# Patient Record
Sex: Female | Born: 1980 | Hispanic: Yes | Marital: Married | Smoking: Never smoker
Health system: Southern US, Community
[De-identification: ages and names within clinical notes are randomized; demographics above are authoritative.]

## PROBLEM LIST (undated history)

## (undated) ENCOUNTER — Inpatient Hospital Stay (HOSPITAL_COMMUNITY): Payer: Self-pay

## (undated) DIAGNOSIS — Z8619 Personal history of other infectious and parasitic diseases: Secondary | ICD-10-CM

## (undated) DIAGNOSIS — Z789 Other specified health status: Secondary | ICD-10-CM

## (undated) DIAGNOSIS — O0930 Supervision of pregnancy with insufficient antenatal care, unspecified trimester: Secondary | ICD-10-CM

## (undated) DIAGNOSIS — N83209 Unspecified ovarian cyst, unspecified side: Secondary | ICD-10-CM

## (undated) DIAGNOSIS — IMO0002 Reserved for concepts with insufficient information to code with codable children: Secondary | ICD-10-CM

## (undated) HISTORY — DX: Unspecified ovarian cyst, unspecified side: N83.209

## (undated) HISTORY — DX: Reserved for concepts with insufficient information to code with codable children: IMO0002

## (undated) HISTORY — DX: Other specified health status: Z78.9

## (undated) HISTORY — DX: Supervision of pregnancy with insufficient antenatal care, unspecified trimester: O09.30

## (undated) HISTORY — PX: CRYOTHERAPY: SHX1416

## (undated) HISTORY — DX: Personal history of other infectious and parasitic diseases: Z86.19

---

## 2002-02-08 ENCOUNTER — Ambulatory Visit (HOSPITAL_COMMUNITY): Admission: RE | Admit: 2002-02-08 | Discharge: 2002-02-08 | Payer: Self-pay | Admitting: *Deleted

## 2002-07-14 ENCOUNTER — Encounter: Admission: RE | Admit: 2002-07-14 | Discharge: 2002-07-14 | Payer: Self-pay | Admitting: Obstetrics and Gynecology

## 2002-07-18 ENCOUNTER — Inpatient Hospital Stay (HOSPITAL_COMMUNITY): Admission: AD | Admit: 2002-07-18 | Discharge: 2002-07-20 | Payer: Self-pay | Admitting: *Deleted

## 2007-03-17 ENCOUNTER — Ambulatory Visit: Payer: Self-pay | Admitting: Obstetrics & Gynecology

## 2007-03-18 ENCOUNTER — Ambulatory Visit: Payer: Self-pay | Admitting: Obstetrics & Gynecology

## 2007-04-15 ENCOUNTER — Ambulatory Visit: Payer: Self-pay | Admitting: *Deleted

## 2007-07-22 ENCOUNTER — Ambulatory Visit: Payer: Self-pay | Admitting: Gynecology

## 2007-07-22 ENCOUNTER — Encounter (INDEPENDENT_AMBULATORY_CARE_PROVIDER_SITE_OTHER): Payer: Self-pay | Admitting: Gynecology

## 2009-10-08 ENCOUNTER — Ambulatory Visit (HOSPITAL_COMMUNITY): Admission: RE | Admit: 2009-10-08 | Discharge: 2009-10-08 | Payer: Self-pay | Admitting: Family Medicine

## 2009-11-15 ENCOUNTER — Ambulatory Visit: Payer: Self-pay | Admitting: Family Medicine

## 2010-06-25 NOTE — Group Therapy Note (Signed)
NAMEANALISSA, BAYLESS NO.:  [id number]   MEDICAL RECORD NO.:  [id number]          PATIENT TYPE:  WOC   LOCATION:  [facility]                   FACILITY:  [facility]   PHYSICIAN:  Ginger Carne, MD DATE OF BIRTH:  Sep 26, 1980   DATE OF SERVICE:  07/22/2007                                  CLINIC NOTE   CHIEF COMPLAINT:  Follow-up from cryotherapy.   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  The patient has no specific complaints today  and this is four months out from her treatment.  She had a Pap smear  performed.  External genitalia, vulva and vagina normal.  Cervix is  smooth without erosions or lesions.   PLAN:  Assuming that this Pap smears normal, she was requested to have  two follow-up Pap smears six months apart and if normal, she can resume  yearly examinations.           ______________________________  Ginger Carne, MD     SHB/MEDQ  D:  07/22/2007  T:  07/22/2007  Job:  [id number]

## 2010-06-25 NOTE — Group Therapy Note (Signed)
NAME:  Emma Walter, Emma Walter NO.:  [id number]   MEDICAL RECORD NO.:  [id number]          PATIENT TYPE:  WOC   LOCATION:  [facility]                   FACILITY:  [facility]   PHYSICIAN:  Karlton Lemon, MD      DATE OF BIRTH:  01/06/1981   DATE OF SERVICE:  04/15/2007                                  CLINIC NOTE   CHIEF COMPLAINT:  Followup from cryotherapy.   HISTORY OF PRESENT ILLNESS:  This is a 30 year old gravida 2, para 2-0-0-  2 following up from cryotherapy performed four weeks ago due to CIN-3 on  colposcopy.  She states she is doing well.  She is no longer having any  watery discharge.  She states she did have a period consisting of just  scant bleeding over the last 2 weeks.  She bled for just a couple of  days.  This was atypical for her normal periods.  She is otherwise doing  well.   PHYSICAL EXAMINATION:  VITAL SIGNS:  Temperature 98.3, pulse 78, blood  pressure 90/63, respiratory rate is 20.  ABDOMEN:  Soft, nontender to palpation.  Positive bowel sounds in all  four quadrants.   ASSESSMENT/PLAN:  This is a 30 year old gravida 2, para 2, with  colposcopy showing CIN-3, now status post cryotherapy performed four  weeks ago on March 18, 2007.  The patient will discuss with Dr. Okey Dupre  and will repeat a Pap smear in 3 months which will be 4 months after her  cryotherapy.           ______________________________  Karlton Lemon, MD     NS/MEDQ  D:  04/15/2007  T:  04/15/2007  Job:  [id number]

## 2010-11-01 LAB — POCT PREGNANCY, URINE
Operator id: 148111
Preg Test, Ur: NEGATIVE

## 2011-02-11 NOTE — L&D Delivery Note (Signed)
Delivery Note At 4:10 AM a healthy female was delivered via Vaginal, Spontaneous Delivery (Presentation: Right Occiput Anterior).  APGAR: 9, 9; weight 8 lb 13.1 oz (4000 g).   Placenta status: Intact, Spontaneous.  Cord: 3 vessels with the following complications: None.    Anesthesia: None  Episiotomy: None Lacerations: 1st degree perineal, minimal not requiring repair Suture Repair: None Est. Blood Loss (mL): 350  Mom to postpartum.  Baby to nursery-stable.  Tana Conch 09/28/2011, 4:47 AM  Supervised by Philipp Deputy, CNM  I have seen and examined this patient and I agree with the above. Cam Hai 7:34 AM 09/28/2011

## 2011-06-10 LAB — OB RESULTS CONSOLE ABO/RH: RH Type: POSITIVE

## 2011-06-10 LAB — OB RESULTS CONSOLE HIV ANTIBODY (ROUTINE TESTING): HIV: NONREACTIVE

## 2011-06-10 LAB — OB RESULTS CONSOLE HEPATITIS B SURFACE ANTIGEN: Hepatitis B Surface Ag: NEGATIVE

## 2011-06-10 LAB — OB RESULTS CONSOLE GC/CHLAMYDIA
Chlamydia: NEGATIVE
Gonorrhea: NEGATIVE

## 2011-06-10 LAB — OB RESULTS CONSOLE RPR: RPR: NONREACTIVE

## 2011-08-26 LAB — OB RESULTS CONSOLE GBS: GBS: NEGATIVE

## 2011-09-24 ENCOUNTER — Telehealth (HOSPITAL_COMMUNITY): Payer: Self-pay | Admitting: *Deleted

## 2011-09-24 ENCOUNTER — Encounter (HOSPITAL_COMMUNITY): Payer: Self-pay | Admitting: *Deleted

## 2011-09-24 NOTE — Telephone Encounter (Signed)
Preadmission screen Interpreter number [phone]

## 2011-09-26 ENCOUNTER — Ambulatory Visit (INDEPENDENT_AMBULATORY_CARE_PROVIDER_SITE_OTHER): Payer: Self-pay | Admitting: *Deleted

## 2011-09-26 VITALS — BP 97/69 | Wt 148.9 lb

## 2011-09-26 DIAGNOSIS — O480 Post-term pregnancy: Secondary | ICD-10-CM

## 2011-09-26 NOTE — Progress Notes (Signed)
P = 69   Pt is scheduled for IOL on 10/03/11 @ 0730.  Labor sx reviewed. Interpreter- Marchelle Folks present during entire visit.  Copy of NST/AFI results faxed to [facility].

## 2011-09-27 ENCOUNTER — Encounter (HOSPITAL_COMMUNITY): Payer: Self-pay | Admitting: *Deleted

## 2011-09-27 ENCOUNTER — Inpatient Hospital Stay (HOSPITAL_COMMUNITY)
Admission: AD | Admit: 2011-09-27 | Discharge: 2011-09-29 | DRG: 775 | Disposition: A | Discharge status: Home / Self Care | Payer: Medicaid Other | Source: Ambulatory Visit | Attending: Obstetrics & Gynecology | Admitting: Obstetrics & Gynecology

## 2011-09-27 DIAGNOSIS — Z3490 Encounter for supervision of normal pregnancy, unspecified, unspecified trimester: Secondary | ICD-10-CM

## 2011-09-27 NOTE — MAU Note (Signed)
Pt to be reevaluated in one hour. 

## 2011-09-27 NOTE — H&P (Signed)
Emma Walter is a 31 y.o. female presenting for labor evaluation.  Contractions started 7 am became more intense around 5pm. Started out every 7 minutes, now every 5 minutes or less. At 9pm, patient states became unable to talk through contractions.   Patient denies decreased fetal movement/blood from vagina (slight brown discharge noted) /rush of fluid.   Maternal Medical History:  Reason for admission: Reason for admission: contractions.  Contractions: Onset was 13-24 hours ago.   Frequency: regular.   Perceived severity is moderate.    Fetal activity: Perceived fetal activity is normal.   Last perceived fetal movement was within the past hour.      OB History    Grav Para Term Preterm Abortions TAB SAB Ect Mult Living   3 2 2       2      Past Medical History  Diagnosis Date  . Hx of chlamydia infection   . Ovarian cyst     History of L  . Abnormal Pap smear   . Late prenatal care   . Language Barrier    Past Surgical History  Procedure Date  . Cryotherapy    Family History: family history includes Birth defects in her sister. Social History:  reports that she has never smoked. She has never used smokeless tobacco. She reports that she does not drink alcohol or use illicit drugs.   Prenatal Transfer Tool  Maternal Diabetes: No Genetic Screening: Declined Maternal Ultrasounds/Referrals: Normal Fetal Ultrasounds or other Referrals:  None Maternal Substance Abuse:  No Significant Maternal Medications:  None Significant Maternal Lab Results:  None Other Comments:  None  ROS-see HPI  Dilation: 6 Effacement (%): 70 Station: -2 Exam by:: a. harper, rnc-ob Blood pressure 110/77, pulse 62, temperature 98.1 F (36.7 C), temperature source Oral, resp. rate 20, height 5\' 2"  (1.575 m), weight 69.4 kg (153 lb), last menstrual period 12/17/2010. Maternal Exam:  Uterine Assessment: Contraction strength is firm.  Contraction duration is 1 minute.  Contraction frequency is regular.      Fetal Exam Fetal Monitor Review: Baseline rate: 125.  Variability: moderate (6-25 bpm).   Pattern: accelerations present and no decelerations.    Fetal State Assessment: Category I - tracings are normal.     Physical Exam  Constitutional: She appears well-developed and well-nourished. No distress.  HENT:  Head: Normocephalic and atraumatic.  Eyes: Conjunctivae and EOM are normal. Pupils are equal, round, and reactive to light.  Neck: Normal range of motion. Neck supple.  Cardiovascular: Normal rate and regular rhythm.  Exam reveals no gallop and no friction rub.   No murmur heard. Respiratory: Effort normal and breath sounds normal. She has no wheezes. She has no rales.  GI: Soft. Bowel sounds are normal.  Musculoskeletal: Normal range of motion. She exhibits edema (1+ edema bilaterally).  Neurological: She is alert.  Skin: Skin is warm and dry.    Prenatal labs: ABO, Rh: O/Positive/-- (04/30 0000) Antibody: Negative (04/30 0000) Rubella: Immune (04/30 0000) RPR: Nonreactive (04/30 0000)  HBsAg: Negative (04/30 0000)  HIV: Non-reactive (04/30 0000)  GBS: Negative (07/16 0000)   Assessment/Plan: Emma Walter 31 y.o. female (HD patient)  [phone] at [redacted]w[redacted]d presenting for labor eval found to be in active labor  -admit to L+D -expectant management  -GBS negative -History of LGA at 9 lb 2 oz with last child but no DM (glucola 101)  -plans breast/bottle feeding -OCP for birth control -previous deliveries without epidural, available  upon request  Tana Conch 09/27/2011, 11:32 PM  I have seen and examined this patient and I agree with the above. Cam Hai 4:14 AM 09/28/2011

## 2011-09-27 NOTE — MAU Note (Signed)
Pt states, " I have had contractions all day, and they are closer and stronger now."

## 2011-09-28 ENCOUNTER — Encounter (HOSPITAL_COMMUNITY): Payer: Self-pay | Admitting: *Deleted

## 2011-09-28 LAB — TYPE AND SCREEN
ABO/RH(D): O POS
Antibody Screen: NEGATIVE

## 2011-09-28 LAB — CBC
Platelets: 121 10*3/uL — ABNORMAL LOW (ref 150–400)
RBC: 3.77 MIL/uL — ABNORMAL LOW (ref 3.87–5.11)
WBC: 10.5 10*3/uL (ref 4.0–10.5)

## 2011-09-28 LAB — RPR: RPR Ser Ql: NONREACTIVE

## 2011-09-28 MED ORDER — LANOLIN HYDROUS EX OINT: TOPICAL_OINTMENT | CUTANEOUS | Status: DC | PRN

## 2011-09-28 MED ORDER — OXYCODONE-ACETAMINOPHEN 5-325 MG PO TABS: 1.0000 | ORAL_TABLET | ORAL | Status: DC | PRN

## 2011-09-28 MED ORDER — SENNOSIDES-DOCUSATE SODIUM 8.6-50 MG PO TABS: 2.0000 | ORAL_TABLET | Freq: Every day | ORAL | Status: DC

## 2011-09-28 MED ORDER — ZOLPIDEM TARTRATE 5 MG PO TABS: 5.0000 mg | ORAL_TABLET | Freq: Every evening | ORAL | Status: DC | PRN

## 2011-09-28 MED ORDER — PRENATAL MULTIVITAMIN CH
1.0000 | ORAL_TABLET | Freq: Every day | ORAL | Status: DC
  Administered 2011-09-28: 1 via ORAL
  Filled 2011-09-28 (×2): qty 1

## 2011-09-28 MED ORDER — ONDANSETRON HCL 4 MG PO TABS: 4.0000 mg | ORAL_TABLET | ORAL | Status: DC | PRN

## 2011-09-28 MED ORDER — ONDANSETRON HCL 4 MG/2ML IJ SOLN: 4.0000 mg | Freq: Four times a day (QID) | INTRAMUSCULAR | Status: DC | PRN

## 2011-09-28 MED ORDER — DIBUCAINE 1 % RE OINT: 1.0000 "application " | TOPICAL_OINTMENT | RECTAL | Status: DC | PRN

## 2011-09-28 MED ORDER — LACTATED RINGERS IV SOLN: 500.0000 mL | INTRAVENOUS | Status: DC | PRN

## 2011-09-28 MED ORDER — IBUPROFEN 600 MG PO TABS: 600.0000 mg | ORAL_TABLET | Freq: Four times a day (QID) | ORAL | Status: DC | PRN

## 2011-09-28 MED ORDER — NALBUPHINE SYRINGE 5 MG/0.5 ML
10.0000 mg | INJECTION | INTRAMUSCULAR | Status: DC | PRN
  Filled 2011-09-28: qty 1

## 2011-09-28 MED ORDER — ACETAMINOPHEN 325 MG PO TABS: 650.0000 mg | ORAL_TABLET | ORAL | Status: DC | PRN

## 2011-09-28 MED ORDER — BENZOCAINE-MENTHOL 20-0.5 % EX AERO
1.0000 "application " | INHALATION_SPRAY | CUTANEOUS | Status: DC | PRN
  Administered 2011-09-28: 1 via TOPICAL
  Filled 2011-09-28: qty 56

## 2011-09-28 MED ORDER — LACTATED RINGERS IV SOLN
INTRAVENOUS | Status: DC
  Administered 2011-09-28: 01:00:00 via INTRAVENOUS

## 2011-09-28 MED ORDER — LIDOCAINE HCL (PF) 1 % IJ SOLN
30.0000 mL | INTRAMUSCULAR | Status: DC | PRN
  Filled 2011-09-28: qty 30

## 2011-09-28 MED ORDER — IBUPROFEN 600 MG PO TABS
600.0000 mg | ORAL_TABLET | Freq: Four times a day (QID) | ORAL | Status: DC
  Administered 2011-09-28 – 2011-09-29 (×4): 600 mg via ORAL
  Filled 2011-09-28 (×5): qty 1

## 2011-09-28 MED ORDER — SIMETHICONE 80 MG PO CHEW: 80.0000 mg | CHEWABLE_TABLET | ORAL | Status: DC | PRN

## 2011-09-28 MED ORDER — WITCH HAZEL-GLYCERIN EX PADS: 1.0000 "application " | MEDICATED_PAD | CUTANEOUS | Status: DC | PRN

## 2011-09-28 MED ORDER — OXYTOCIN 40 UNITS IN LACTATED RINGERS INFUSION - SIMPLE MED
62.5000 mL/h | Freq: Once | INTRAVENOUS | Status: DC
  Filled 2011-09-28: qty 1000

## 2011-09-28 MED ORDER — FLEET ENEMA 7-19 GM/118ML RE ENEM: 1.0000 | ENEMA | RECTAL | Status: DC | PRN

## 2011-09-28 MED ORDER — TETANUS-DIPHTH-ACELL PERTUSSIS 5-2.5-18.5 LF-MCG/0.5 IM SUSP: 0.5000 mL | Freq: Once | INTRAMUSCULAR | Status: DC

## 2011-09-28 MED ORDER — ONDANSETRON HCL 4 MG/2ML IJ SOLN: 4.0000 mg | INTRAMUSCULAR | Status: DC | PRN

## 2011-09-28 MED ORDER — DIPHENHYDRAMINE HCL 25 MG PO CAPS: 25.0000 mg | ORAL_CAPSULE | Freq: Four times a day (QID) | ORAL | Status: DC | PRN

## 2011-09-28 MED ORDER — OXYTOCIN BOLUS FROM INFUSION
250.0000 mL | Freq: Once | INTRAVENOUS | Status: DC
  Filled 2011-09-28: qty 500

## 2011-09-28 MED ORDER — CITRIC ACID-SODIUM CITRATE 334-500 MG/5ML PO SOLN: 30.0000 mL | ORAL | Status: DC | PRN

## 2011-09-28 NOTE — Progress Notes (Signed)
SVD of viable female 

## 2011-09-28 NOTE — Progress Notes (Signed)
Emma Walter is a 31 y.o. [phone] at [redacted]w[redacted]d admitted for active labor  Subjective:   Objective: BP 111/73  Pulse 78  Temp 98.1 F (36.7 C) (Oral)  Resp 20  Ht 5\' 2"  (1.575 m)  Wt 69.4 kg (153 lb)  BMI 27.98 kg/m2  LMP 12/17/2010      FHT:  FHR: 140 bpm, variability: moderate,  accelerations:  Present,  decelerations:  Absent UC:   regular, every 3-4 minutes SVE:   Dilation: 8.5 Effacement (%): 90 Station: -2 Exam by:: [employer] RN  Labs: Lab Results  Component Value Date   WBC 10.5 09/28/2011   HGB 10.4* 09/28/2011   HCT 31.8* 09/28/2011   MCV 84.4 09/28/2011   PLT 121* 09/28/2011    Assessment / Plan: Spontaneous labor, progressing normally S/p AROM  Labor: Progressing normally Fetal Wellbeing:  Category I Pain Control:  Labor support without medications I/D:  n/a Anticipated MOD:  NSVD  Emma Walter 09/28/2011, 3:43 AM

## 2011-09-29 LAB — CBC
Hemoglobin: 10.3 g/dL — ABNORMAL LOW (ref 12.0–15.0)
MCV: 85 fL (ref 78.0–100.0)
Platelets: 125 10*3/uL — ABNORMAL LOW (ref 150–400)
RBC: 3.74 MIL/uL — ABNORMAL LOW (ref 3.87–5.11)
WBC: 12.9 10*3/uL — ABNORMAL HIGH (ref 4.0–10.5)

## 2011-09-29 MED ORDER — IBUPROFEN 600 MG PO TABS: 600.0000 mg | ORAL_TABLET | Freq: Four times a day (QID) | ORAL | Status: AC | PRN

## 2011-09-29 MED ORDER — SENNOSIDES-DOCUSATE SODIUM 8.6-50 MG PO TABS: 2.0000 | ORAL_TABLET | Freq: Every day | ORAL | Status: AC

## 2011-09-29 MED ORDER — PRENATAL MULTIVITAMIN CH: 1.0000 | ORAL_TABLET | Freq: Every day | ORAL | Status: DC

## 2011-09-29 NOTE — Discharge Instructions (Signed)
Parto vaginal Cuidados luego (Vaginal Delivery, Care After)  Cambie el apsito cada vez que vaya al bao.   Lmpiese suavemente con un papel tis durante su estada en el hospital, y siempre hgalo desde adelante hacia atrs. Puede utilizar un rociador con agua caliente del grifo o el tis descartable, o ambos.   Coloque el apsito y el tis sucios en el cesto de basura del bao, en la bolsa plstica de color rojo.   Durante su permanencia en el hospital, guarde los cogulos. Si elimina un cogulo, por favor no tire la cadena. Tambin, si su flujo vaginal le parece excesivo, notifquelo al personal de enfermera.   La primera vez que se levante de la cama despus del parto, espere la ayuda de la enfermera. No se levante sola en ningn momento si se siente dbil o mareada.   Flexione y extienda los tobillos vigorosamente, de modo que pueda sentir que las pantorrillas se endurecen, media docena de veces por hora, cuando est en la cama y despierta.   No se siente sobre un pie suyo ni deje las piernas colgando del borde de la cama ni mantenga una posicin que dificulte la circulacin de las piernas.   Muchas mujeres experimentan dolores de entuerto (contracciones uterinas leves) en los dos o tres das despus del [place]. Pdale a la enfermera un medicamento contra el dolor si lo necesita. Algunas veces la alimentacin a pecho estimula los "entuertos"; si le ocurre esto, pida la medicacin  -  de hora antes de la prxima vez que alimente al beb.   Para su proteccin y la de su beb, le pedimos que no traspase las puertas dobles de la entrada de la unidad de obstetricia. No lleve al beb en brazos por el corredor. Cuando saque o entre al beb de la habitacin, por favor colquelo en el cochecito y empjelo.   [employer] a sus bebs en la habitacin todo lo que deseen.  Document Released: 01/27/2005 Document Revised: 01/16/2011 [facility] Patient Information 2012 [place], [place].

## 2011-09-29 NOTE — Discharge Summary (Signed)
Obstetric Discharge Summary Reason for Admission: onset of labor Prenatal Procedures: NST Intrapartum Procedures: spontaneous vaginal delivery Postpartum Procedures: none Complications-Operative and Postpartum: minimal 1st degree perineal laceration, not requiring repair Hemoglobin  Date Value Range Status  09/29/2011 10.3* 12.0 - 15.0 g/dL Final     HCT  Date Value Range Status  09/29/2011 31.8* 36.0 - 46.0 % Final    Physical Exam:  BP 106/72  Pulse 68  Temp 98.5 F (36.9 C) (Oral)  Resp 18  Ht 5\' 2"  (1.575 m)  Wt 69.4 kg (153 lb)  BMI 27.98 kg/m2  LMP 12/17/2010  Breastfeeding? Unknown General: alert and cooperative Lochia: appropriate Uterine Fundus: firm DVT Evaluation: No evidence of DVT seen on physical exam. No cords or calf tenderness.  Discharge Diagnoses: Term Pregnancy-delivered Kaina Orengo 31 y.o. female  Now [phone] who at [redacted]w[redacted]d delivered a healthy female via Vaginal, Spontaneous Delivery (Presentation: Right Occiput Anterior) with  APGAR: 9, 9 and weight 8 lb 13.1 oz (4000 g). Intact placenta with 3 vessel cord. Minimal blood loss at 350 mL.  Minimal 1st degree laceration not requiring repair. Mother solely breastfeeding at time of discharge with plans for some bottle feeding. Plans for oral contraceptive pill for contraception.   Discharge Information: Date: 09/29/2011 Activity: pelvic rest Diet: routine Medications: PNV, Ibuprofen and Colace Condition: stable Instructions: refer to practice specific booklet Discharge to: home   Newborn Data: Live born female  Birth Weight: 8 lb 13.1 oz (4000 g) APGAR: 9, 9  Home with mother.  Tana Conch 09/29/2011, 7:16 AM  I have seen this patient and agree with the above resident's note.  LEFTWICH-KIRBY, Dontrae Morini Certified Nurse-Midwife

## 2011-09-29 NOTE — Progress Notes (Signed)
UR chart review completed.  

## 2011-09-30 NOTE — Progress Notes (Signed)
NST 09/26/11 reactive 

## 2011-10-03 ENCOUNTER — Inpatient Hospital Stay (HOSPITAL_COMMUNITY): Admission: RE | Admit: 2011-10-03 | Payer: Self-pay | Source: Ambulatory Visit

## 2011-10-07 NOTE — Discharge Summary (Signed)
As noted

## 2013-12-12 ENCOUNTER — Encounter (HOSPITAL_COMMUNITY): Payer: Self-pay | Admitting: *Deleted

## 2017-11-21 ENCOUNTER — Encounter (HOSPITAL_COMMUNITY): Payer: Self-pay | Admitting: *Deleted

## 2017-11-21 ENCOUNTER — Inpatient Hospital Stay (HOSPITAL_COMMUNITY)
Admission: AD | Admit: 2017-11-21 | Discharge: 2017-11-21 | Disposition: A | Discharge status: Home / Self Care | Payer: Self-pay | Source: Ambulatory Visit | Attending: Family Medicine | Admitting: Family Medicine

## 2017-11-21 ENCOUNTER — Inpatient Hospital Stay (HOSPITAL_COMMUNITY): Payer: Self-pay

## 2017-11-21 DIAGNOSIS — O200 Threatened abortion: Secondary | ICD-10-CM

## 2017-11-21 DIAGNOSIS — O209 Hemorrhage in early pregnancy, unspecified: Secondary | ICD-10-CM

## 2017-11-21 DIAGNOSIS — Z3A10 10 weeks gestation of pregnancy: Secondary | ICD-10-CM

## 2017-11-21 LAB — COMPREHENSIVE METABOLIC PANEL
ALBUMIN: 4.3 g/dL (ref 3.5–5.0)
ALK PHOS: 55 U/L (ref 38–126)
ALT: 13 U/L (ref 0–44)
AST: 16 U/L (ref 15–41)
Anion gap: 7 (ref 5–15)
BILIRUBIN TOTAL: 0.4 mg/dL (ref 0.3–1.2)
BUN: 17 mg/dL (ref 6–20)
CO2: 24 mmol/L (ref 22–32)
CREATININE: 0.79 mg/dL (ref 0.44–1.00)
Calcium: 9.1 mg/dL (ref 8.9–10.3)
Chloride: 104 mmol/L (ref 98–111)
GFR calc Af Amer: >60 mL/min (ref >60)
GLUCOSE: 93 mg/dL (ref 70–99)
POTASSIUM: 3.3 mmol/L — AB (ref 3.5–5.1)
Sodium: 135 mmol/L (ref 135–145)
TOTAL PROTEIN: 7.1 g/dL (ref 6.5–8.1)

## 2017-11-21 LAB — CBC WITH DIFFERENTIAL/PLATELET
BASOS ABS: 0 10*3/uL (ref 0.0–0.1)
BASOS PCT: 0 %
Eosinophils Absolute: 0.1 10*3/uL (ref 0.0–0.5)
Eosinophils Relative: 1 %
HEMATOCRIT: 32.7 % — AB (ref 36.0–46.0)
HEMOGLOBIN: 10.8 g/dL — AB (ref 12.0–15.0)
Lymphocytes Relative: 30 %
Lymphs Abs: 2.1 10*3/uL (ref 0.7–4.0)
MCH: 26.9 pg (ref 26.0–34.0)
MCHC: 33 g/dL (ref 30.0–36.0)
MCV: 81.5 fL (ref 80.0–100.0)
MONO ABS: 0.2 10*3/uL (ref 0.1–1.0)
Monocytes Relative: 3 %
NEUTROS ABS: 4.6 10*3/uL (ref 1.7–7.7)
NEUTROS PCT: 66 %
Platelets: 136 10*3/uL — ABNORMAL LOW (ref 150–400)
RBC: 4.01 MIL/uL (ref 3.87–5.11)
RDW: 17.7 % — ABNORMAL HIGH (ref 11.5–15.5)
WBC: 7.1 10*3/uL (ref 4.0–10.5)
nRBC: 0 % (ref 0.0–0.2)

## 2017-11-21 LAB — URINALYSIS, ROUTINE W REFLEX MICROSCOPIC
Bacteria, UA: NONE SEEN
Bilirubin Urine: NEGATIVE
GLUCOSE, UA: NEGATIVE mg/dL
Ketones, ur: NEGATIVE mg/dL
Nitrite: NEGATIVE
Protein, ur: NEGATIVE mg/dL
SPECIFIC GRAVITY, URINE: 1.01 (ref 1.005–1.030)
pH: 6 (ref 5.0–8.0)

## 2017-11-21 LAB — POCT PREGNANCY, URINE: PREG TEST UR: POSITIVE — AB

## 2017-11-21 LAB — HCG, QUANTITATIVE, PREGNANCY: HCG, BETA CHAIN, QUANT, S: 25601 m[IU]/mL — AB (ref <5)

## 2017-11-21 NOTE — Progress Notes (Signed)
Venia Carbon NP and Trinna Post, in house spanish interpreter in to discuss test results and d/c plan. Written and verbal d/c instructions given and understanding voiced,.

## 2017-11-21 NOTE — Discharge Instructions (Signed)
Reposo pélvico °(Pelvic Rest) °¿CUÁNDO SE RECOMIENDA EL REPOSO PÉLVICO? °El reposo pélvico puede recomendarse en los siguientes casos: °· La placenta cubre de forma parcial o total la abertura del cuello del útero (placenta previa). °· Hay sangrado entre la pared del útero y el saco amniótico en el primer trimestre de embarazo (hemorragia subcoriónica). °· El trabajo de parto comienza muy pronto (trabajo de parto prematuro). °Según la salud general de la madre y el feto, el médico decidirá si el reposo pélvico es adecuado. °¿CÓMO HAGO REPOSO PÉLVICO? °Durante el tiempo que le indique el médico: °· No tenga relaciones sexuales, estimulación sexual ni orgasmos. °· No use tampones. No se haga duchas vaginales. No se introduzca nada en la vagina. °· No levante ningún objeto que pese más de 10 libras (4,5 kg). °· Evite las actividades que demanden mucho esfuerzo (extenuantes). °· Evite las actividades que requieran esfuerzos de los músculos de la pelvis. °¿CUÁNDO DEBO BUSCAR ATENCIÓN MÉDICA? °Solicite atención médica de inmediato si: °· Tiene cólicos en la zona inferior del abdomen. °· Tiene secreción de flujo vaginal. °· Tiene un dolor sordo en la parte baja de la espalda. °· Tiene contracciones regulares. °· Tienen tensión uterina. °¿CUÁNDO DEBO BUSCAR ASISTENCIA MÉDICA INMEDIATA? °Solicite atención médica de inmediato si: °· Tiene sangrado vaginal y está embarazada. °Esta información no tiene como fin reemplazar el consejo del médico. Asegúrese de hacerle al médico cualquier pregunta que tenga. °Document Released: 10/22/2011 Document Revised: 05/21/2015 Document Reviewed: 07/31/2014 °Elsevier Interactive Patient Education © 2018 Elsevier Inc. ° °

## 2017-11-21 NOTE — MAU Note (Signed)
Started having some spotting Thurs night mostly when wiped. Today having alittle more bleeding that is pink. No pain but alittle pelvic pressure

## 2017-11-21 NOTE — MAU Provider Note (Signed)
History     CSN: [id number]  Arrival date and time: 11/21/17 1916   None     Chief Complaint  Patient presents with  . Vaginal Bleeding   HPI   Emma Walter is a 37 y.o. female [id number] At 52w6dby uncertain LMP, here in MAU with pink vaginal spotting. This is a new complaint. The spotting started on Thursday. The spotting became heavier today. The Spotting occurs mainly when she wipes. She denies abdominal pain at this time. 0/10 pain. Some pressure here and there.   OB History    Gravida  4   Para  3   Term  3   Preterm      AB      Living  3     SAB      TAB      Ectopic      Multiple      Live Births  3           Past Medical History:  Diagnosis Date  . Abnormal Pap smear   . Hx of chlamydia infection   . Language barrier   . Late prenatal care   . Ovarian cyst    History of L    Past Surgical History:  Procedure Laterality Date  . CRYOTHERAPY      Family History  Problem Relation Age of Onset  . Birth defects Sister        had a "soft head" did not survive    Social History   Tobacco Use  . Smoking status: Never Smoker  . Smokeless tobacco: Never Used  Substance Use Topics  . Alcohol use: No  . Drug use: No    Allergies: No Known Allergies  Medications Prior to Admission  Medication Sig Dispense Refill Last Dose  . Prenatal Vit-Fe Fumarate-FA (PRENATAL MULTIVITAMIN) TABS Take 1 tablet by mouth daily. 11 tablet 30 11/21/2017 at Unknown time   Results for orders placed or performed during the hospital encounter of 11/21/17 (from the past 48 hour(s))  Pregnancy, urine POC     Status: Abnormal   Collection Time: 11/21/17  7:42 PM  Result Value Ref Range   Preg Test, Ur POSITIVE (A) NEGATIVE    Comment:        THE SENSITIVITY OF THIS METHODOLOGY IS >24 mIU/mL   Urinalysis, Routine w reflex microscopic     Status: Abnormal   Collection Time: 11/21/17  7:43 PM  Result Value Ref Range   Color, Urine STRAW (A) YELLOW   APPearance CLEAR CLEAR   Specific Gravity, Urine 1.010 1.005 - 1.030   pH 6.0 5.0 - 8.0   Glucose, UA NEGATIVE NEGATIVE mg/dL   Hgb urine dipstick MODERATE (A) NEGATIVE   Bilirubin Urine NEGATIVE NEGATIVE   Ketones, ur NEGATIVE NEGATIVE mg/dL   Protein, ur NEGATIVE NEGATIVE mg/dL   Nitrite NEGATIVE NEGATIVE   Leukocytes, UA TRACE (A) NEGATIVE   RBC / HPF 0-5 0 - 5 RBC/hpf   WBC, UA 0-5 0 - 5 WBC/hpf   Bacteria, UA NONE SEEN NONE SEEN   Squamous Epithelial / LPF 0-5 0 - 5    Comment: Performed at [facility] [address] [place] [id number] CBC with Differential/Platelet     Status: Abnormal   Collection Time: 11/21/17  8:13 PM  Result Value Ref Range   WBC 7.1 4.0 - 10.5 K/uL   RBC 4.01 3.87 - 5.11 MIL/uL   Hemoglobin 10.8 (L) 12.0 - 15.0 g/dL  HCT 32.7 (L) 36.0 - 46.0 %   MCV 81.5 80.0 - 100.0 fL   MCH 26.9 26.0 - 34.0 pg   MCHC 33.0 30.0 - 36.0 g/dL   RDW 17.7 (H) 11.5 - 15.5 %   Platelets 136 (L) 150 - 400 K/uL   nRBC 0.0 0.0 - 0.2 %   Neutrophils Relative % 66 %   Neutro Abs 4.6 1.7 - 7.7 K/uL   Lymphocytes Relative 30 %   Lymphs Abs 2.1 0.7 - 4.0 K/uL   Monocytes Relative 3 %   Monocytes Absolute 0.2 0.1 - 1.0 K/uL   Eosinophils Relative 1 %   Eosinophils Absolute 0.1 0.0 - 0.5 K/uL   Basophils Relative 0 %   Basophils Absolute 0.0 0.0 - 0.1 K/uL    Comment: Performed at [facility], [address], [place], [place] [address]  Comprehensive metabolic panel     Status: Abnormal   Collection Time: 11/21/17  8:13 PM  Result Value Ref Range   Sodium 135 135 - 145 mmol/L   Potassium 3.3 (L) 3.5 - 5.1 mmol/L   Chloride 104 98 - 111 mmol/L   CO2 24 22 - 32 mmol/L   Glucose, Bld 93 70 - 99 mg/dL   BUN 17 6 - 20 mg/dL   Creatinine, Ser 0.79 0.44 - 1.00 mg/dL   Calcium 9.1 8.9 - 10.3 mg/dL   Total Protein 7.1 6.5 - 8.1 g/dL   Albumin 4.3 3.5 - 5.0 g/dL   AST 16 15 - 41 U/L   ALT 13 0 - 44 U/L   Alkaline Phosphatase 55 38 - 126 U/L   Total  Bilirubin 0.4 0.3 - 1.2 mg/dL   GFR calc non Af Amer >60 >60 mL/min   GFR calc Af Amer >60 >60 mL/min    Comment: (NOTE) The eGFR has been calculated using the CKD EPI equation. This calculation has not been validated in all clinical situations. eGFR's persistently <60 mL/min signify possible Chronic Kidney Disease.    Anion gap 7 5 - 15    Comment: Performed at [facility], [address], [place], [place] [address]  hCG, quantitative, pregnancy     Status: Abnormal   Collection Time: 11/21/17  8:13 PM  Result Value Ref Range   hCG, Beta Chain, Quant, S 25,601 (H) <5 mIU/mL    Comment:          GEST. AGE      CONC.  (mIU/mL)   <=1 WEEK        5 - 50     2 WEEKS       50 - 500     3 WEEKS       100 - 10,000     4 WEEKS     1,000 - 30,000     5 WEEKS     3,500 - 115,000   6-8 WEEKS     12,000 - 270,000    12 WEEKS     15,000 - 220,000        FEMALE AND NON-PREGNANT FEMALE:     LESS THAN 5 mIU/mL Performed at [facility], [address] [place], [place] [address]    US Ob Less Than 14 Weeks With Ob Transvaginal  Result Date: 11/21/2017 CLINICAL DATA:  Bleeding. Quantitative beta HCG is pending. Estimated gestational age by LMP is 10 weeks 6 days. EXAM: OBSTETRIC <14 WK [place] AND TRANSVAGINAL OB US TECHNIQUE: Both transabdominal and transvaginal ultrasound examinations were performed  for complete evaluation of the gestation as well as the maternal uterus, adnexal regions, and pelvic cul-de-sac. Transvaginal technique was performed to assess early pregnancy. COMPARISON:  None. FINDINGS: Intrauterine gestational sac: A single intrauterine gestational sac is present. The gestational sac is somewhat irregular in shape and appears to have some internal debris. Yolk sac:  Yolk sac is not identified. Embryo:  A small fetal pole is identified. Cardiac Activity: Fetal cardiac activity is not identified. CRL: 5.1 mm   6 w   1 d                  [place] EDC: 07/16/2018 Subchorionic hemorrhage:   None visualized. Maternal uterus/adnexae: The uterus is anteverted and slightly retroflexed. No myometrial mass lesions identified. The right ovary is visualized and appears normal. Left ovary is not identified. No free pelvic fluid collections. IMPRESSION: Single intrauterine gestational sac. Gestational sac appears somewhat irregular. Yolk sac is not visualized. A fetal pole is identified but no fetal cardiac activity seen in fetal pole of less than 7 mm. Findings are suspicious but not yet definitive for failed pregnancy. Recommend follow-up US in 10-14 days for definitive diagnosis. This recommendation follows SRU consensus guidelines: Diagnostic Criteria for Nonviable Pregnancy Early in the First Trimester. [place] [employer] Med 2013; 354:6568-12. Electronically Signed   By: Lucienne Capers M.D.   On: 11/21/2017 22:18   Review of Systems  Constitutional: Positive for fever.  Gastrointestinal: Positive for anal bleeding. Negative for diarrhea and vomiting.   Physical Exam   Blood pressure 100/70, pulse 70, temperature 97.6 F (36.4 C), resp. rate 18, height 5' 2"  (1.575 m), weight 64 kg, last menstrual period 09/06/2017, unknown if currently breastfeeding.  Physical Exam  Constitutional: She is oriented to person, place, and time. Vital signs are normal. She appears well-developed and well-nourished.  Non-toxic appearance. She does not have a sickly appearance. She does not appear ill. No distress.  Eyes: Pupils are equal, round, and reactive to light.  Respiratory: Effort normal.  GI: Soft.  Genitourinary:  Genitourinary Comments: Bimanual exam: Cervix closed,posterior. Small amount of pink discharge noted on exam glove Uterus non tender, enlarged  Adnexa non tender, no masses bilaterally Chaperone present for exam.   Musculoskeletal: Normal range of motion.  Neurological: She is alert and oriented to person, place, and time.  Skin: Skin is warm. She is not diaphoretic.  Psychiatric: Her  behavior is normal.   MAU Course  Procedures  None  MDM  O positive blood type  HIV, CBC, Hcg, ABO US OB transvaginal  Spanish interpretor used to review [place] results with patient and her significant other. Discussed [place] results in detail. All questions answered.    Assessment and Plan   A:  1. Threatened miscarriage in early pregnancy   2. [redacted] weeks gestation of pregnancy   3. Vaginal bleeding in pregnancy, first trimester     P:  Discharge home in stable condition Return to MAU if symptoms worsen Bleeding precautions F/U US in 7 days for viability, message sent to the [facility] for f/u following US Pelvic rest Support given  Lezlie Lye, NP 11/21/2017 10:57 PM

## 2017-11-22 ENCOUNTER — Inpatient Hospital Stay (HOSPITAL_COMMUNITY)
Admission: AD | Admit: 2017-11-22 | Discharge: 2017-11-23 | Disposition: A | Discharge status: Home / Self Care | Payer: Self-pay | Source: Ambulatory Visit | Attending: Obstetrics and Gynecology | Admitting: Obstetrics and Gynecology

## 2017-11-22 ENCOUNTER — Inpatient Hospital Stay (HOSPITAL_COMMUNITY): Payer: Self-pay

## 2017-11-22 ENCOUNTER — Encounter (HOSPITAL_COMMUNITY): Payer: Self-pay

## 2017-11-22 DIAGNOSIS — O3680X0 Pregnancy with inconclusive fetal viability, not applicable or unspecified: Secondary | ICD-10-CM

## 2017-11-22 DIAGNOSIS — O209 Hemorrhage in early pregnancy, unspecified: Secondary | ICD-10-CM

## 2017-11-22 DIAGNOSIS — O200 Threatened abortion: Secondary | ICD-10-CM

## 2017-11-22 DIAGNOSIS — Z3A01 Less than 8 weeks gestation of pregnancy: Secondary | ICD-10-CM | POA: Insufficient documentation

## 2017-11-22 LAB — CBC
HCT: 32.6 % — ABNORMAL LOW (ref 36.0–46.0)
HEMOGLOBIN: 10.8 g/dL — AB (ref 12.0–15.0)
MCH: 27.2 pg (ref 26.0–34.0)
MCHC: 33.1 g/dL (ref 30.0–36.0)
MCV: 82.1 fL (ref 80.0–100.0)
PLATELETS: 124 10*3/uL — AB (ref 150–400)
RBC: 3.97 MIL/uL (ref 3.87–5.11)
RDW: 17.8 % — ABNORMAL HIGH (ref 11.5–15.5)
WBC: 12.2 10*3/uL — ABNORMAL HIGH (ref 4.0–10.5)
nRBC: 0 % (ref 0.0–0.2)

## 2017-11-22 LAB — HCG, QUANTITATIVE, PREGNANCY: hCG, Beta Chain, Quant, S: 19733 m[IU]/mL — ABNORMAL HIGH (ref <5)

## 2017-11-22 NOTE — MAU Provider Note (Signed)
History     CSN: [id number]  Arrival date and time: 11/22/17 2125   First Provider Initiated Contact with Patient 11/22/17 2238      Chief Complaint  Patient presents with  . Vaginal Bleeding  . Abdominal Pain   HPI  Emma Walter is a 37 y.o. [phone] at [redacted]w[redacted]d who presents to MAU with chief complaint of heavy vaginal bleeding, new onset this morning. Also c/o abdominal cramping 4-8/10, bilateral low abdomen, does not radiate, no aggravating or alleviating factors. Denies SOB, syncope, weakness.  These are existing problems, worsening in intensity. Patient was cared for in MAU 11/21/17 and diagnosed with threatened miscarriage.   OB History    Gravida  4   Para  3   Term  3   Preterm      AB      Living  3     SAB      TAB      Ectopic      Multiple      Live Births  3           Past Medical History:  Diagnosis Date  . Abnormal Pap smear   . Hx of chlamydia infection   . Language barrier   . Late prenatal care   . Ovarian cyst    History of L    Past Surgical History:  Procedure Laterality Date  . CRYOTHERAPY      Family History  Problem Relation Age of Onset  . Birth defects Sister        had a "soft head" did not survive    Social History   Tobacco Use  . Smoking status: Never Smoker  . Smokeless tobacco: Never Used  Substance Use Topics  . Alcohol use: No  . Drug use: No    Allergies: No Known Allergies  No medications prior to admission.    Review of Systems  Constitutional: Negative for chills, fatigue and fever.  Gastrointestinal: Positive for abdominal pain. Negative for nausea and vomiting.  Genitourinary: Positive for vaginal bleeding. Negative for difficulty urinating, dyspareunia, dysuria, pelvic pain and vaginal pain.  Neurological: Negative for dizziness, seizures, syncope, weakness, light-headedness and headaches.  All other systems reviewed and are negative.  Physical Exam   Blood pressure  91/62, pulse 75, temperature (!) 97.4 F (36.3 C), temperature source Oral, resp. rate 19, height 5\' 2"  (1.575 m), weight 64 kg, last menstrual period 09/06/2017, unknown if currently breastfeeding.  Physical Exam  Constitutional: She is oriented to person, place, and time. She appears well-developed and well-nourished.  Cardiovascular: Normal rate, normal heart sounds and intact distal pulses.  GI: Soft. She exhibits no distension. There is no tenderness. There is no rebound and no guarding.  Genitourinary: Vaginal discharge found.  Genitourinary Comments: Patient actively bleeding upon initial Provider assessment Dark red bleeding removed with fox swabs x 6  Neurological: She is alert and oriented to person, place, and time. She has normal reflexes.  Skin: Skin is warm and dry.  Psychiatric: She has a normal mood and affect. Her behavior is normal. Judgment and thought content normal.      MAU Course  Procedures  MDM  Plan of care and diagnosis discussed at length with patient and FOB using iPad interpreter. Reviewed possible next steps based on evolution of  quant hCG.  Patient Vitals for the past 24 hrs:  BP Temp Temp src Pulse Resp Height Weight  11/23/17 0036 91/62 - - 75  19 - -  11/22/17 2227 98/64 - - 61 - - -  11/22/17 2211 (!) 95/57 (!) 97.4 F (36.3 C) Oral 62 16 5\' 2"  (1.575 m) 64 kg    Results for orders placed or performed during the hospital encounter of 11/22/17 (from the past 24 hour(s))  CBC     Status: Abnormal   Collection Time: 11/22/17 10:29 PM  Result Value Ref Range   WBC 12.2 (H) 4.0 - 10.5 K/uL   RBC 3.97 3.87 - 5.11 MIL/uL   Hemoglobin 10.8 (L) 12.0 - 15.0 g/dL   HCT 16.1 (L) 09.6 - 04.5 %   MCV 82.1 80.0 - 100.0 fL   MCH 27.2 26.0 - 34.0 pg   MCHC 33.1 30.0 - 36.0 g/dL   RDW 40.9 (H) 81.1 - 91.4 %   Platelets 124 (L) 150 - 400 K/uL   nRBC 0.0 0.0 - 0.2 %  hCG, quantitative, pregnancy     Status: Abnormal   Collection Time: 11/22/17 10:29 PM   Result Value Ref Range   hCG, Beta Chain, Quant, S 19,733 (H) <5 mIU/mL    US Ob Less Than 14 Weeks With Ob Transvaginal  Result Date: 11/22/2017 CLINICAL DATA:  Bleeding. Estimated gestational age by LMP is 11 weeks 0 days. Quantitative beta HCG is 25,601 yesterday. EXAM: OBSTETRIC <14 WK [place] AND TRANSVAGINAL OB US TECHNIQUE: Both transabdominal and transvaginal ultrasound examinations were performed for complete evaluation of the gestation as well as the maternal uterus, adnexal regions, and pelvic cul-de-sac. Transvaginal technique was performed to assess early pregnancy. COMPARISON:  11/21/2017 FINDINGS: Intrauterine gestational sac: A single intrauterine gestational sac is identified. Yolk sac:  Not identified. Embryo:  Fetal pole is identified. Cardiac Activity: Not identified. CRL: 5.3 mm   6 w   1 d Subchorionic hemorrhage:  None visualized. Maternal uterus/adnexae: Uterus is anteverted. No myometrial mass lesions identified. Both ovaries are visualized and appear normal. Probable corpus luteal cyst on the left ovary. Small amount of free fluid in the pelvis. IMPRESSION: Single intrauterine gestational sac which appears somewhat irregular and large with respect to the fetal pole. Yolk sac is not visualized. No fetal cardiac activity and a fetal pole of less than 7 mm. Findings are suspicious but not yet definitive for failed pregnancy. Recommend follow-up US in 10-14 days for definitive diagnosis. This recommendation follows SRU consensus guidelines: Diagnostic Criteria for Nonviable Pregnancy Early in the First Trimester. Malva Limes Med 2013; 782:9562-13. Electronically Signed   By: Burman Nieves M.D.   On: 11/22/2017 23:29     Assessment and Plan  --37 y.o. G4P3003 at [redacted]w[redacted]d  --Pregnancy of unknown viability vs threatened miscarriage --hCG decreasing from previous assessment 25,601-->19,733 --Reviewed bleeding precautions, anticipated changes in bleeding and pain level, criteria for return to  MAU --Discharge home in stable condition  F/U: repeat quant Wednesday 11/25/17 @ [facility] at 0820  Calvert Cantor, CNM 11/23/2017, 3:47 AM

## 2017-11-22 NOTE — MAU Note (Signed)
Pt was here yesterday with sm amt of bleeding, reports bleeding increased tonight @ 1700 along with lower abd cramping she rates an "8".

## 2017-11-23 LAB — CULTURE, OB URINE
Culture: NO GROWTH
Special Requests: NORMAL

## 2017-11-23 NOTE — Discharge Instructions (Signed)
Human Chorionic Gonadotropin, HCG injection Qu es este medicamento? La GONADOTROPINA CORINICA HUMANA es una hormona. La HCG se utiliza con diferentes fines en hombres y [place]. La HCG se utiliza en combinacin con otros medicamentos para la fertilidad para aumentar las posibilidades de que una mujer quede [place]. La HCG ayuda a estimular la produccin de testosterona y de esperma en los hombres y adolescentes varones. La HCG adems puede utilizarse para algunos nios varones con criptorquidia, un problema de nacimiento especfico de los testculos. Este medicamento puede ser utilizado para otros usos; si tiene alguna pregunta consulte con su proveedor de atencin mdica o con su farmacutico. MARCAS COMUNES: Novarel, Ovidrel, Pregnyl Qu le debo informar a mi profesional de la salud antes de tomar este medicamento? Necesitan saber si usted presenta alguno de los siguientes problemas o situaciones: asma quiste ovrico enfermedad cardiaca migraa enfermedad renal cncer ovrico u otro cncer relacionado con las mujeres cncer de prstata u otro cncer relacionado con los hombres convulsiones una reaccin alrgica o inusual a la [facility], a otras hormonas, a otros medicamentos, alimentos, colorantes o conservantes si est embarazada (este medicamento no debe usarse si usted ya est embarazada) si est amamantando a un beb Cmo debo utilizar este medicamento? Este medicamento se inyecta por va intramuscular, por ejemplo en el muslo o las nalgas o se puede administrar mediante inyeccin por va subcutnea. Consulte a su mdico sobre que [address] de inyectarse es mejor para usted. Le ensearn cmo preparar y Engineer, materials. selo exactamente como se le indique. Use su dosis a intervalos regulares. No use su medicamento con una frecuencia mayor a la indicada. Es importante que deseche las agujas y las jeringas usadas en un recipiente resistente a los pinchazos. No las deseche en una basura. Si  no tiene un recipiente resistente a los pinchazos, consulte a Film/video editor o su proveedor de atencin para obtenerlo. Hable con su pediatra para informarse acerca del uso de este medicamento en nios. Aunque este medicamento se puede recetar a los nios tan menores como varios meses de edad para condiciones selectivas, las precauciones se aplican. Sobredosis: Pngase en contacto inmediatamente con un centro toxicolgico o una sala de urgencia si usted cree que haya tomado demasiado medicamento. ATENCIN: [employer] es solo para usted. No comparta este medicamento con nadie. Qu sucede si me olvido de una dosis? Es importante no olvidar ninguna dosis. Informe a su mdico o a su profesional de la salud si no puede asistir a Marketing executive. Para hombres o nios varones: Si se administra la inyeccin usted mismo y Rex dosis, inyctese la dosis en cuanto se acuerde. Si no se acuerda [employer], sltese la dosis [place] y contine con su horario normal. No use dosis dobles o adicionales. Comunquese con su mdico si tiene alguna duda. Para las mujeres que reciben tratamientos para la infertilidad: Es importante que no olvide ninguna dosis, ya que el xito del tratamiento para la fertilidad depende de la utilizacin apropiada de este medicamento. Comunquese con su mdico o con su profesional de la salud si no puede asistir a Marketing executive. Si se lo inyecta usted misma, no use dosis dobles o adicionales. Comunquese con su mdico si tiene alguna duda. Qu puede interactuar con este medicamento? suplementos dietticos o de hierbas, tales como cohosh azul, cohosh negro o sauzgatillo Puede ser que esta lista no menciona todas las posibles interacciones. Informe a su profesional de [address] de [employer] productos a base de hierbas, medicamentos de  venta libre o suplementos nutritivos que est tomando. Si usted fuma, consume bebidas alcohlicas o si utiliza drogas ilegales, indqueselo tambin a su  profesional de [employer]. Algunas sustancias pueden interactuar con su medicamento. A qu debo estar atento al usar [employer]? Para hombres o nios varones: Su mdico deber supervisarlo atentamente. Informe a su mdico si observa algn efecto inusual. Para las mujeres que reciben tratamientos para la infertilidad: Su mdico deber supervisarte atentamente. Pueden usar los [place] de [place], [place] o ecografas para supervisar su tratamiento. Si cree que ha quedado [place], comunquese con su mdico inmediatamente. Consulte a su mdico Chief Operating Officer consumo de alcohol y disminuir el uso de tabaco durante su tratamiento para la fertilidad. Qu efectos secundarios puedo tener al [employer] este medicamento? Efectos secundarios que debe informar a su mdico o a Producer, television/film/video de la salud tan pronto como sea posible: Therapist, art, como erupcin cutnea, picazn o urticarias, e hinchazn de la cara, los labios o la lengua problemas respiratorios aumento del tamao de las mamas aumento del tamao del pene y los testculos nuseas, vmito dolor o distensin en la pelvis aumento sbito de la altura aumento de peso repentino dificultad para Geographical information systems officer o cambios en el volumen de orina Efectos secundarios que generalmente no requieren atencin mdica (infrmelos a su mdico o a Producer, television/film/video de la salud si persisten o si son molestos): acn cambios en las emociones o el estado de nimo crecimiento de vello facial dolor de Public house manager, irritacin o Psychologist, counselling de la inyeccin cansancio Programme researcher, broadcasting/film/video Puede ser que esta lista no menciona todos los posibles efectos secundarios. Comunquese a su mdico por asesoramiento mdico [employer]. Usted puede informar los efectos secundarios a la FDA por telfono al 1-800-FDA-1088. Dnde debo guardar mi medicina? Mantngala fuera del alcance de los nios. Es posible que no necesite Teacher, English as a foreign language en su domicilio.  Si est recibiendo Nurse, learning disability, Scientist, clinical (histocompatibility and immunogenetics) a su farmacutico sobre las instrucciones para guardar el producto que [place]. Deseche todos los medicamentos que no haya utilizado, despus de la fecha de vencimiento. ATENCIN: Este folleto es un resumen. Puede ser que no cubra toda la posible informacin. Si usted tiene preguntas acerca de esta medicina, consulte con su mdico, su farmacutico o su profesional de Radiographer, therapeutic.  2018 Elsevier/Gold Standard (2015-05-14 00:00:00)

## 2017-11-25 ENCOUNTER — Ambulatory Visit (INDEPENDENT_AMBULATORY_CARE_PROVIDER_SITE_OTHER): Payer: Self-pay | Admitting: General Practice

## 2017-11-25 DIAGNOSIS — O039 Complete or unspecified spontaneous abortion without complication: Secondary | ICD-10-CM

## 2017-11-25 DIAGNOSIS — O200 Threatened abortion: Secondary | ICD-10-CM

## 2017-11-25 LAB — HCG, QUANTITATIVE, PREGNANCY: HCG, BETA CHAIN, QUANT, S: 3899 m[IU]/mL — AB (ref <5)

## 2017-11-25 NOTE — Progress Notes (Signed)
Patient presents to office today for stat bhcg. Patient reports bleeding has slowed down now and is similar to a period. Patient denies pain. Patient states she passed something big yesterday and thinks it was the baby. Discussed with patient we are monitoring your bhcg levels today & asked she wait in lobby for results/updated plan of care. Patient verbalized understanding & had no questions at this time.  Reviewed results with Dr Earlene Plater who finds decreasing bhcg levels indicative of SAB. Patient should have follow up bhcg in 1 week, not stat. Orders placed.  Informed patient of results, discussed follow up in 1 week, discussed SAB's are usually isolated events and not indicative of future pregnancies, recommended if she desires pregnancy to continue PNV and discussed waiting 2-3 normal menstrual cycles before trying again. Patient verbalized understanding to all and had no questions. Mariel used for interpreter today.  Marylynn Pearson, RN BSN IBCLC.

## 2017-11-25 NOTE — Progress Notes (Signed)
I have reviewed this chart and agree with the RN/CMA assessment and management.    K. Meryl Davis, M.D. [facility]  

## 2017-11-30 ENCOUNTER — Other Ambulatory Visit: Payer: Self-pay | Admitting: *Deleted

## 2017-12-02 ENCOUNTER — Other Ambulatory Visit: Payer: Self-pay

## 2017-12-02 DIAGNOSIS — O039 Complete or unspecified spontaneous abortion without complication: Secondary | ICD-10-CM

## 2017-12-03 LAB — BETA HCG QUANT (REF LAB): hCG Quant: 296 m[IU]/mL

## 2018-10-08 ENCOUNTER — Encounter (HOSPITAL_COMMUNITY): Payer: Self-pay

## 2019-09-01 ENCOUNTER — Other Ambulatory Visit: Payer: Self-pay | Admitting: Obstetrics & Gynecology

## 2019-09-01 ENCOUNTER — Other Ambulatory Visit: Payer: Self-pay | Admitting: Obstetrics and Gynecology

## 2019-09-01 DIAGNOSIS — Z1231 Encounter for screening mammogram for malignant neoplasm of breast: Secondary | ICD-10-CM

## 2019-09-13 ENCOUNTER — Ambulatory Visit
Admission: RE | Admit: 2019-09-13 | Discharge: 2019-09-13 | Disposition: A | Discharge status: Home / Self Care | Payer: No Typology Code available for payment source | Source: Ambulatory Visit | Attending: Obstetrics and Gynecology | Admitting: Obstetrics and Gynecology

## 2019-09-13 ENCOUNTER — Other Ambulatory Visit: Payer: Self-pay

## 2019-09-13 DIAGNOSIS — Z1231 Encounter for screening mammogram for malignant neoplasm of breast: Secondary | ICD-10-CM

## 2019-12-10 IMAGING — US US OB < 14 WEEKS - US OB TV
1 series · 15 of 28 positions shown · non-contrast
Comparison: None.

CLINICAL DATA: Bleeding. Quantitative beta HCG is pending.
Estimated gestational age by LMP is 10 weeks 6 days.

EXAM:
OBSTETRIC <14 WK US AND TRANSVAGINAL OB US
TECHNIQUE: Both transabdominal and transvaginal ultrasound examinations were
performed for complete evaluation of the gestation as well as the
maternal uterus, adnexal regions, and pelvic cul-de-sac.
Transvaginal technique was performed to assess early pregnancy.

[Series 1: us ob < 14 weeks - us ob tv · 38 acquisitions, 15 frames shown]
[im 1/38]
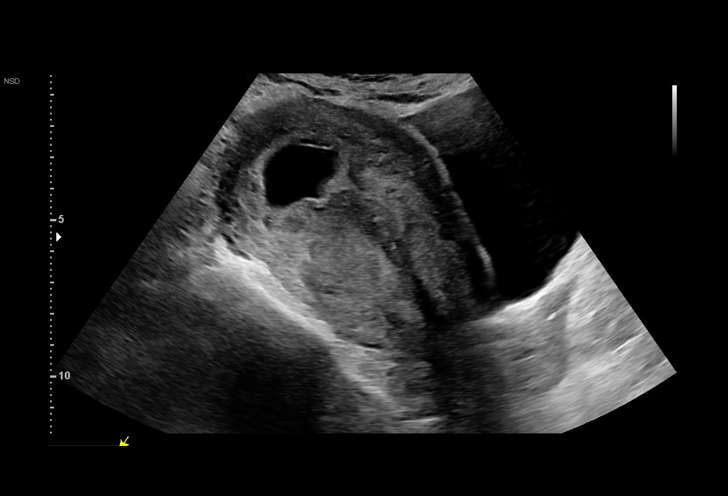
[im 3/38]
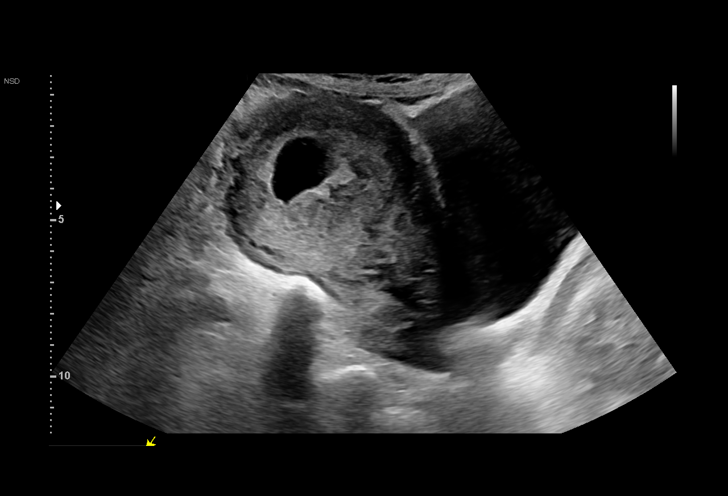
[im 6/38]
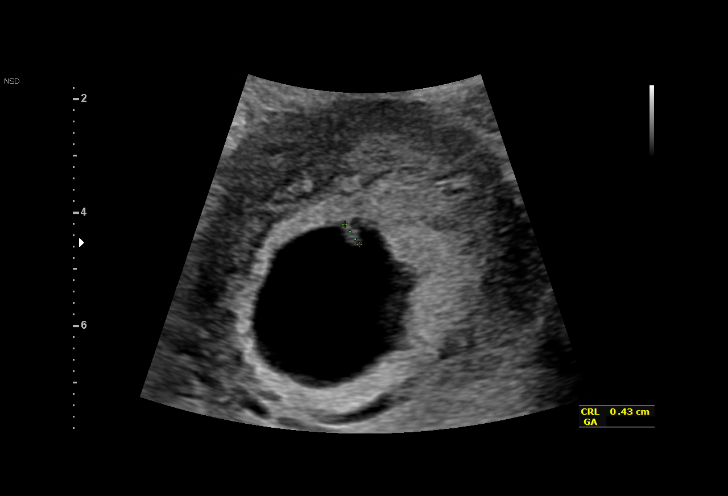
[im 9/38]
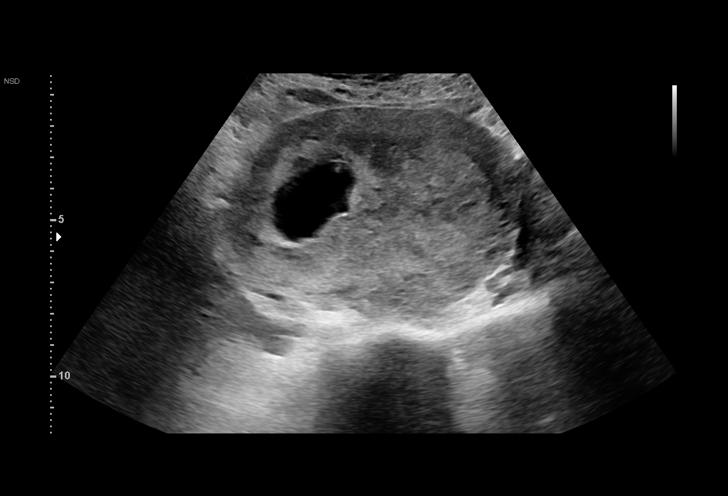
[im 11/38]
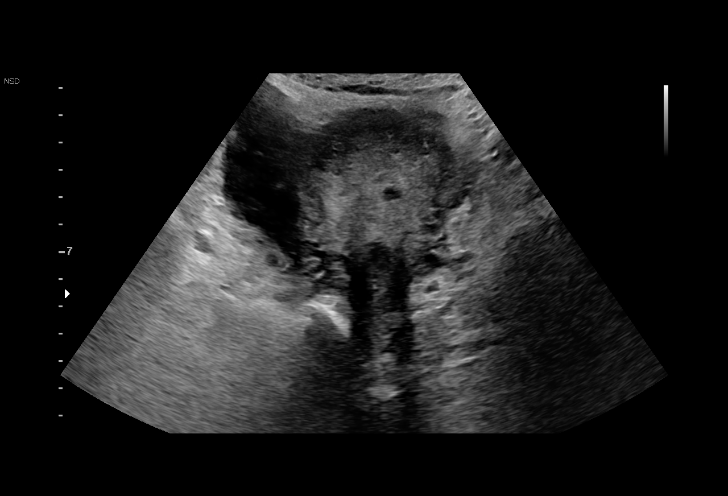
[im 14/38]
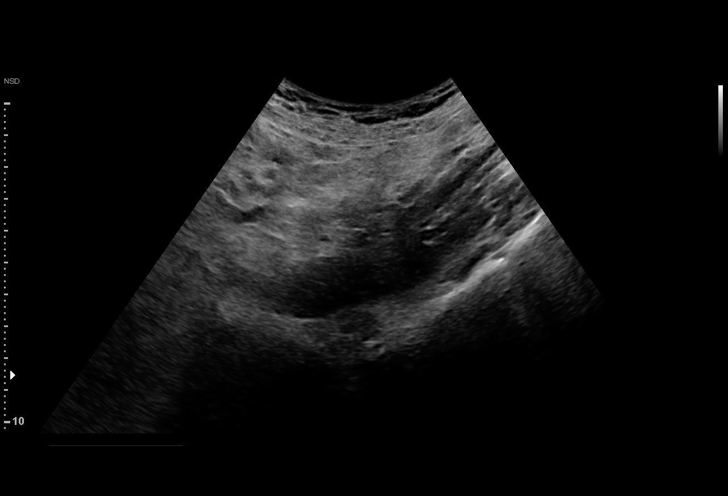
[im 17/38]
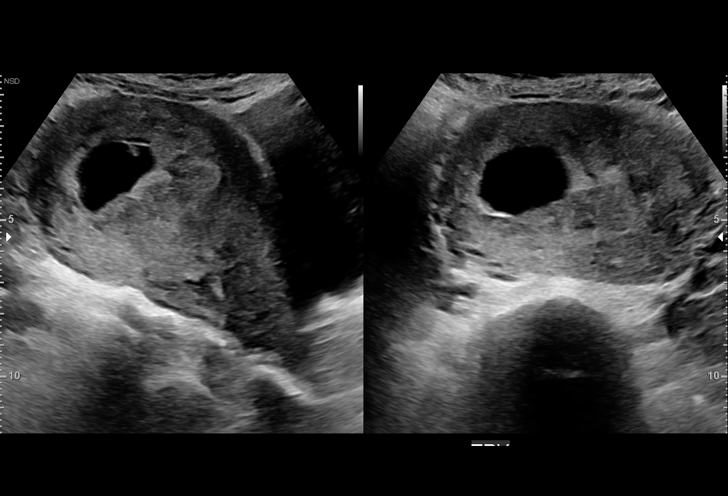
[im 20/38]
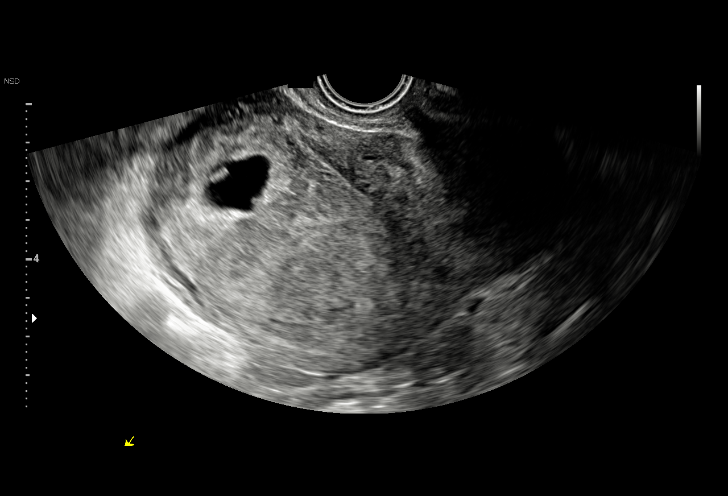
[im 21/38]
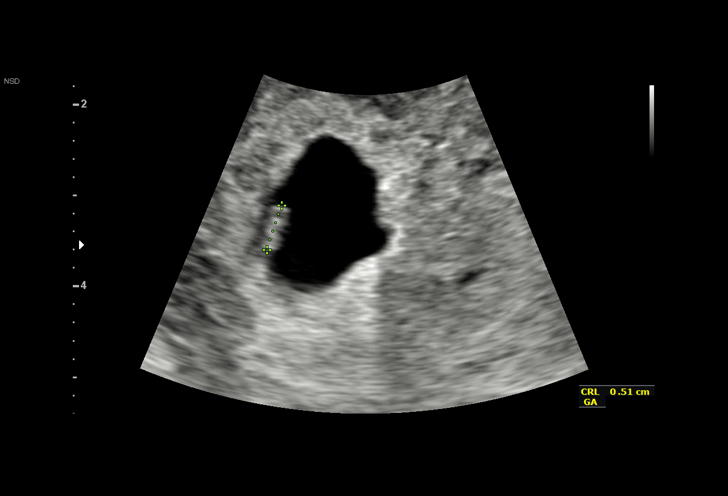
[im 24/38]
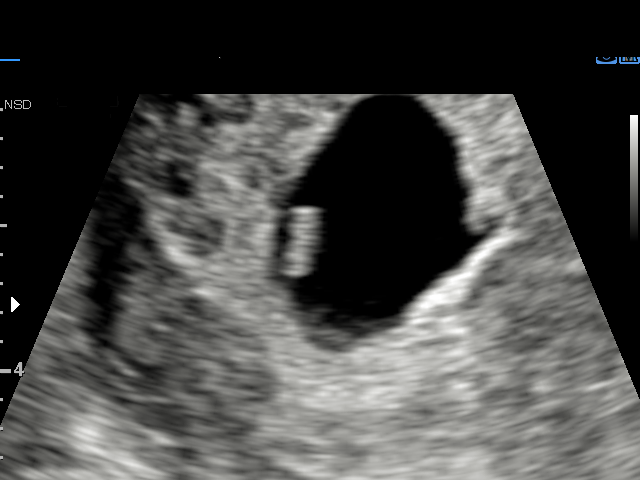
[im 27/38]
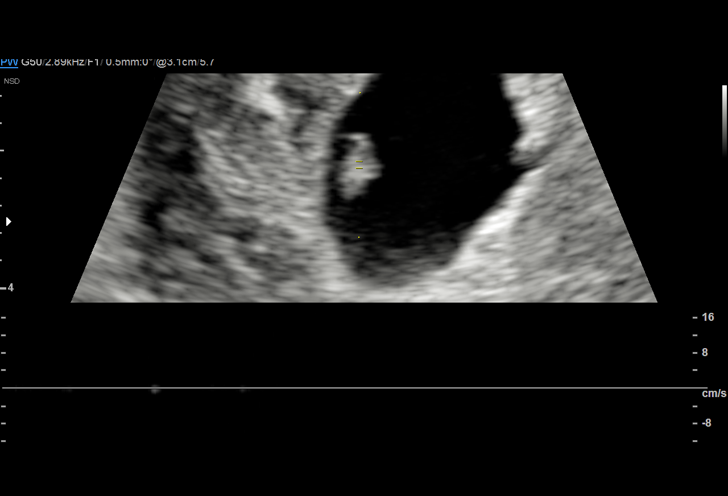
[im 29/38]
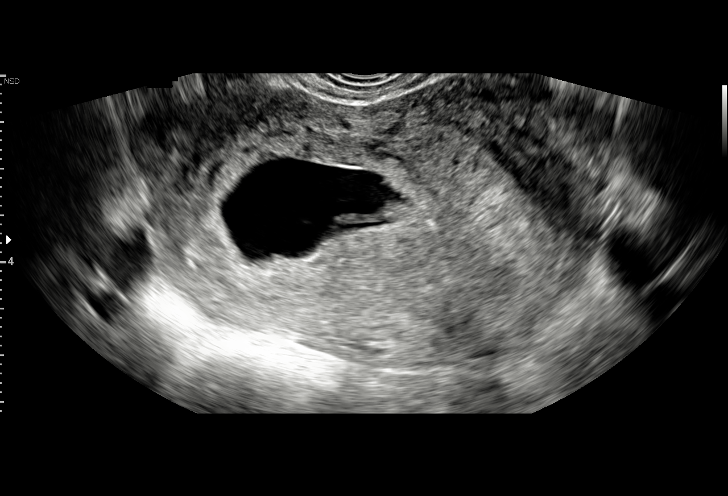
[im 32/38]
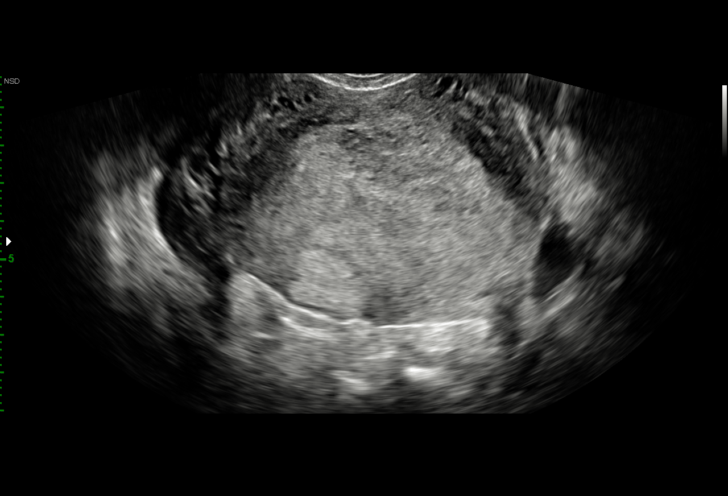
[im 35/38]
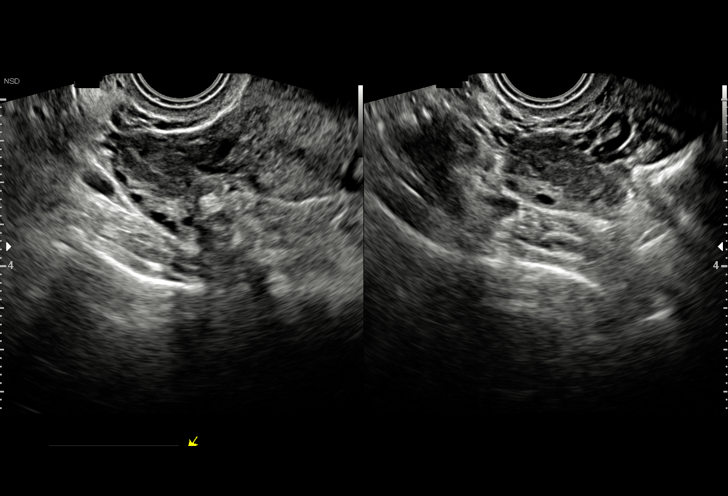
[im 38/38]
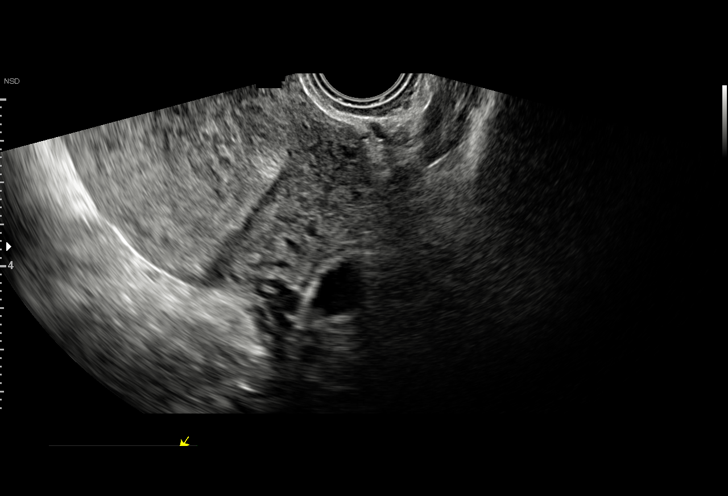

[15 of 28 positions shown; findings below may reference images not displayed]

FINDINGS: Intrauterine gestational sac: A single intrauterine gestational sac
is present. The gestational sac is somewhat irregular in shape and
appears to have some internal debris.

Yolk sac:  Yolk sac is not identified.

Embryo:  A small fetal pole is identified.

Cardiac Activity: Fetal cardiac activity is not identified.

CRL: 5.1 mm   6 w   1 d                  US EDC: 07/16/2018

Subchorionic hemorrhage:  None visualized.

Maternal uterus/adnexae: The uterus is anteverted and slightly
retroflexed. No myometrial mass lesions identified. The right ovary
is visualized and appears normal. Left ovary is not identified. No
free pelvic fluid collections.
IMPRESSION: Single intrauterine gestational sac. Gestational sac appears
somewhat irregular. Yolk sac is not visualized. A fetal pole is
identified but no fetal cardiac activity seen in fetal pole of less
than 7 mm. Findings are suspicious but not yet definitive for failed
pregnancy. Recommend follow-up US in 10-14 days for definitive
diagnosis. This recommendation follows SRU consensus guidelines:
Diagnostic Criteria for Nonviable Pregnancy Early in the First
Trimester. N Engl J Med 0141; [DATE].

## 2019-12-11 IMAGING — US US OB < 14 WEEKS - US OB TV
1 series · 15 of 26 positions shown · non-contrast
Comparison: 11/21/2017

CLINICAL DATA: Bleeding. Estimated gestational age by LMP is 11
weeks 0 days. Quantitative beta HCG is 25,601 yesterday.

EXAM:
OBSTETRIC <14 WK US AND TRANSVAGINAL OB US
TECHNIQUE: Both transabdominal and transvaginal ultrasound examinations were
performed for complete evaluation of the gestation as well as the
maternal uterus, adnexal regions, and pelvic cul-de-sac.
Transvaginal technique was performed to assess early pregnancy.

[Series 1: us ob < 14 weeks - us ob tv · 26 acquisitions, 15 frames shown]
[im 1/26]
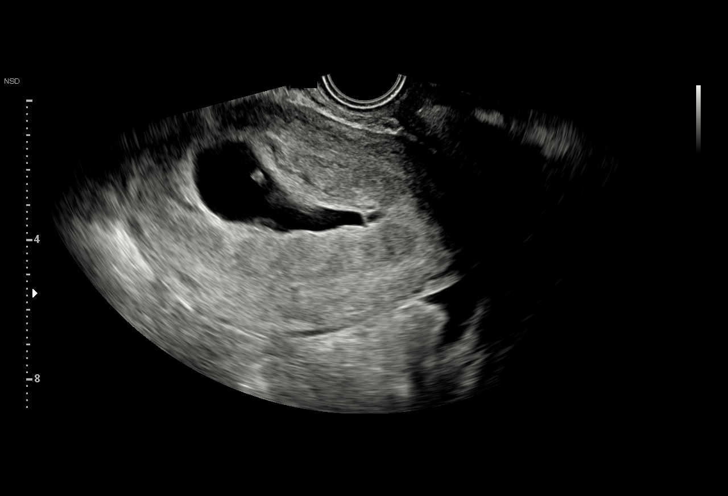
[im 3/26]
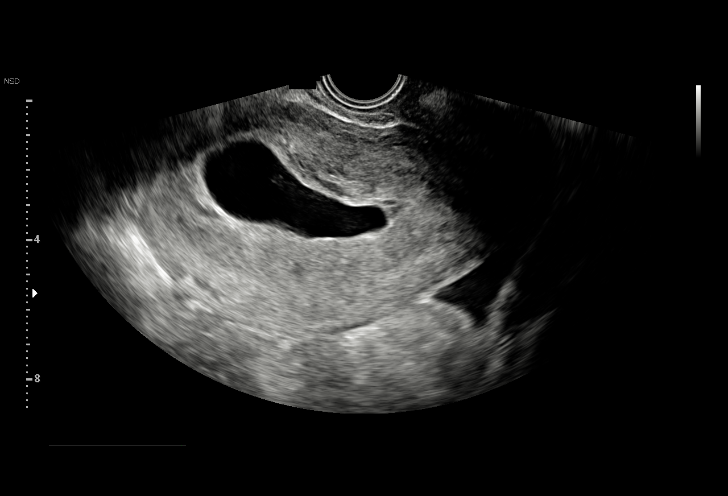
[im 5/26]
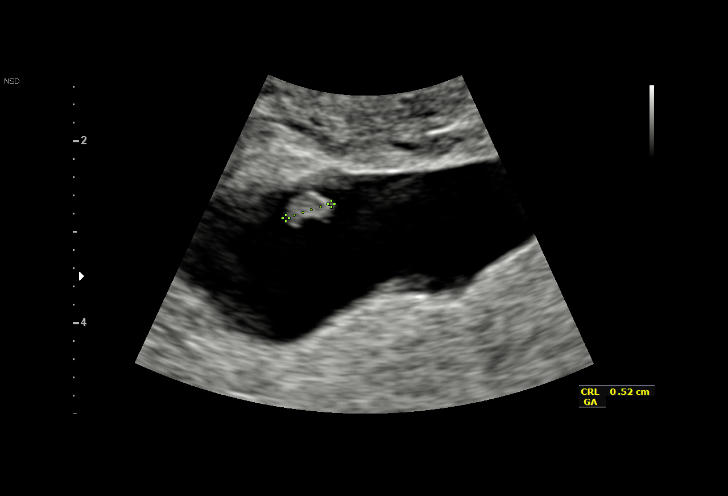
[im 7/26]
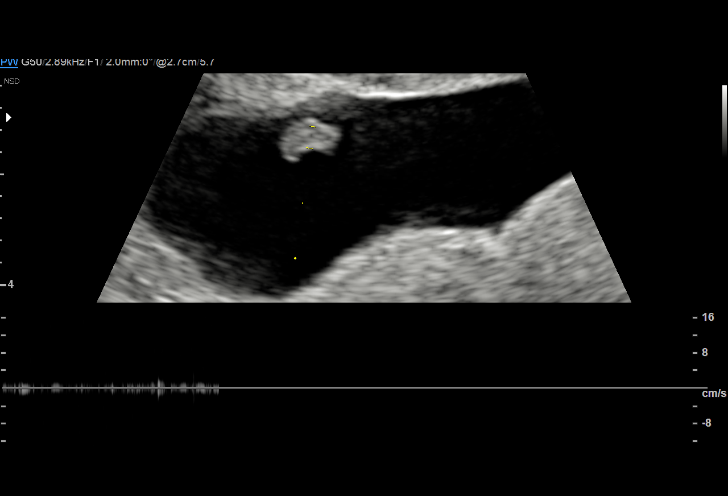
[im 8/26]
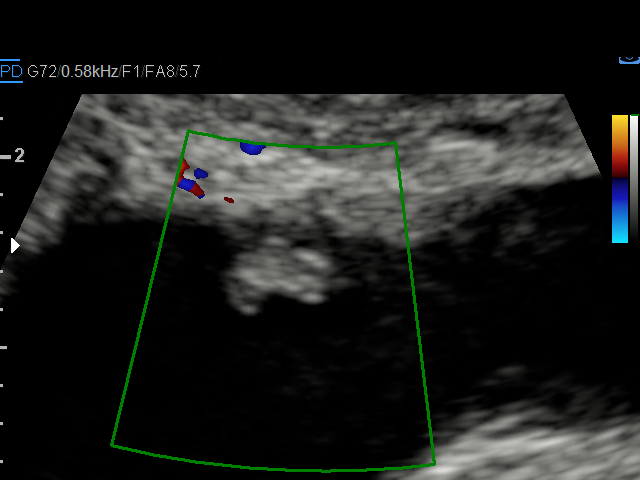
[im 10/26]
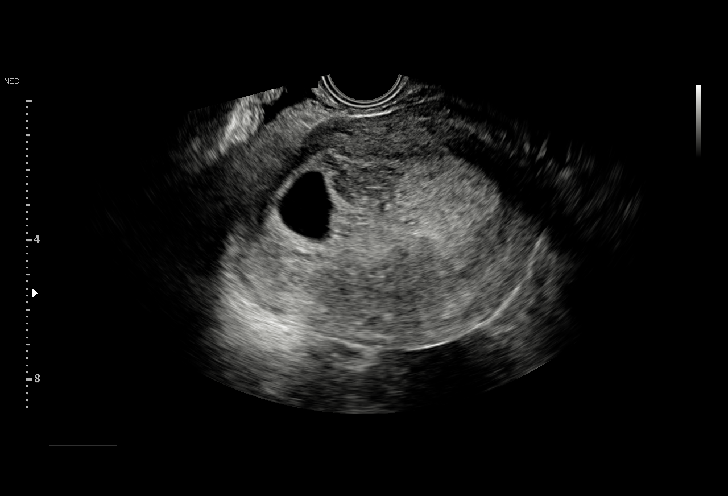
[im 12/26]
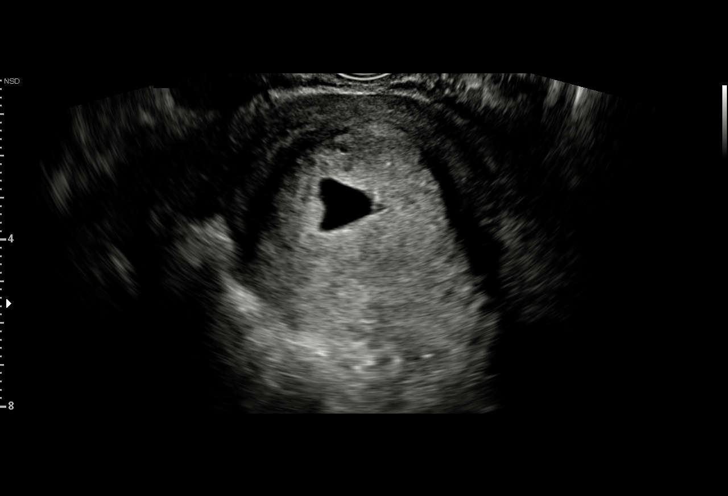
[im 14/26]
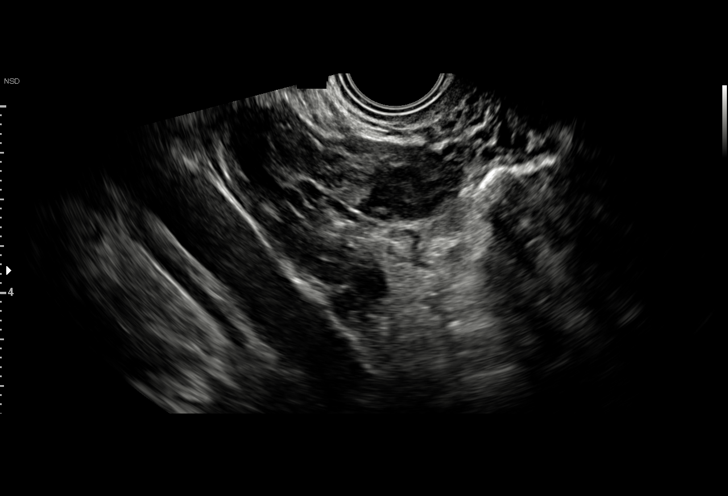
[im 15/26]
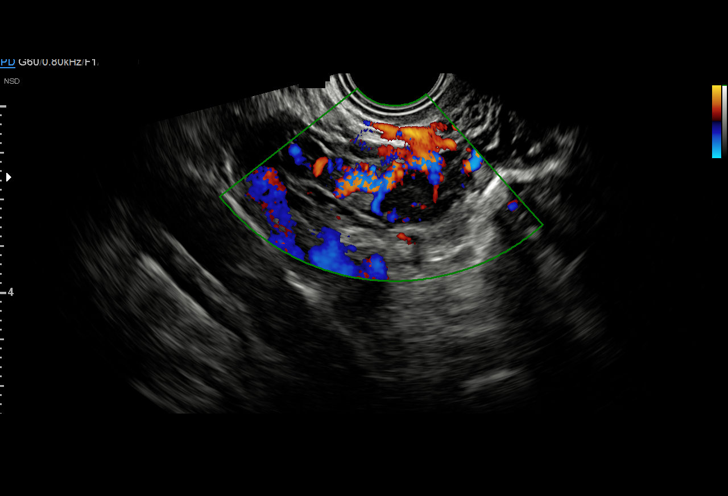
[im 17/26]
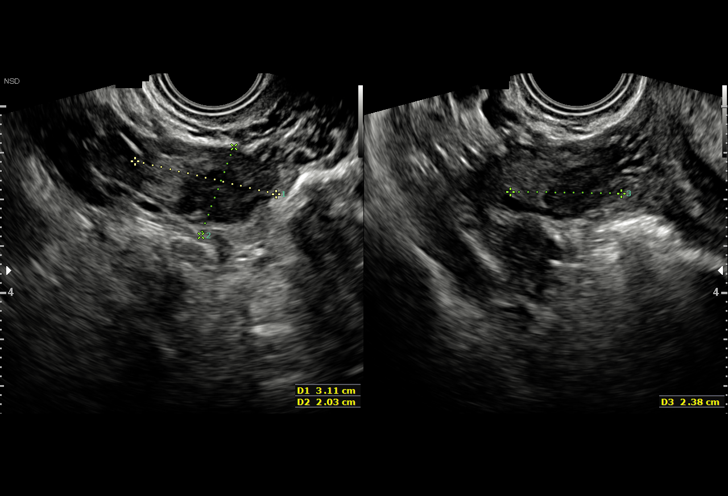
[im 19/26]
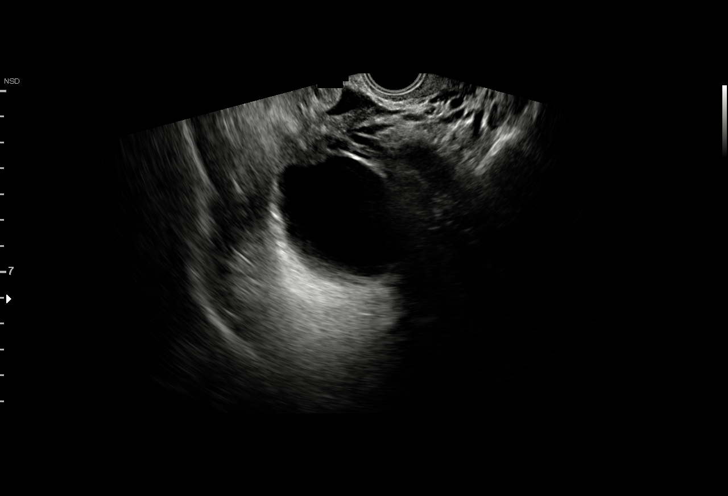
[im 20/26]
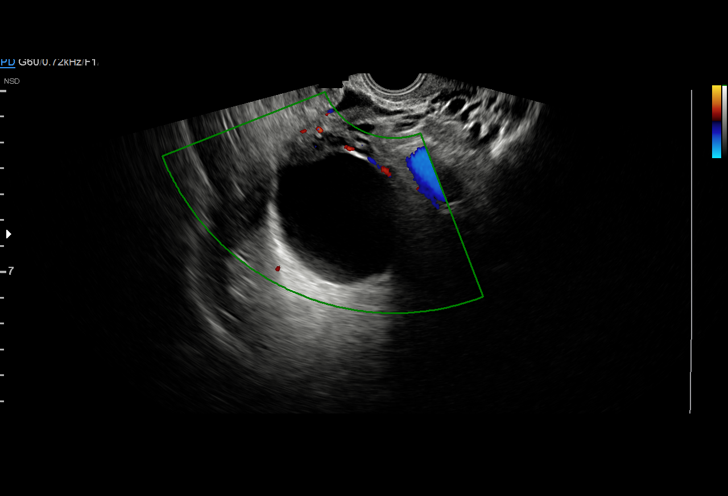
[im 22/26]
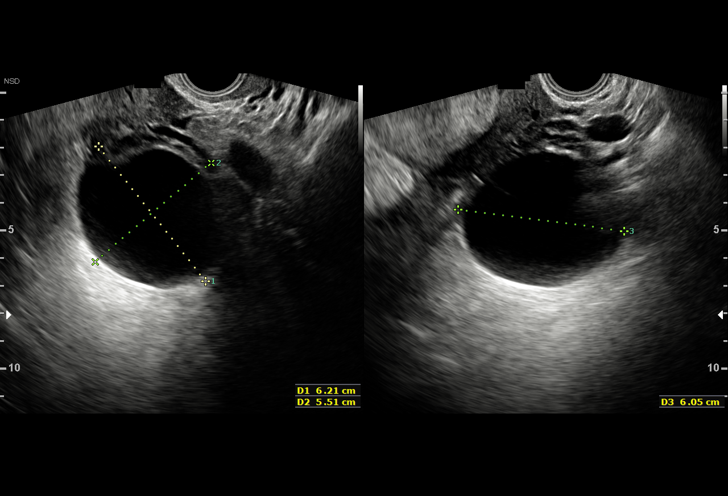
[im 24/26]
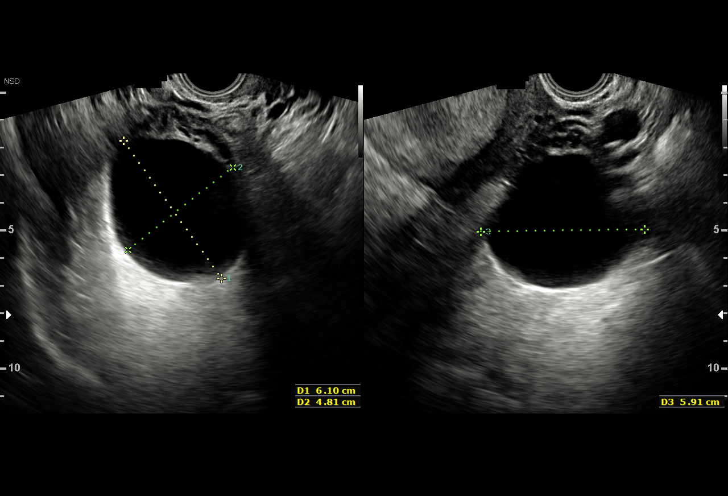
[im 26/26]
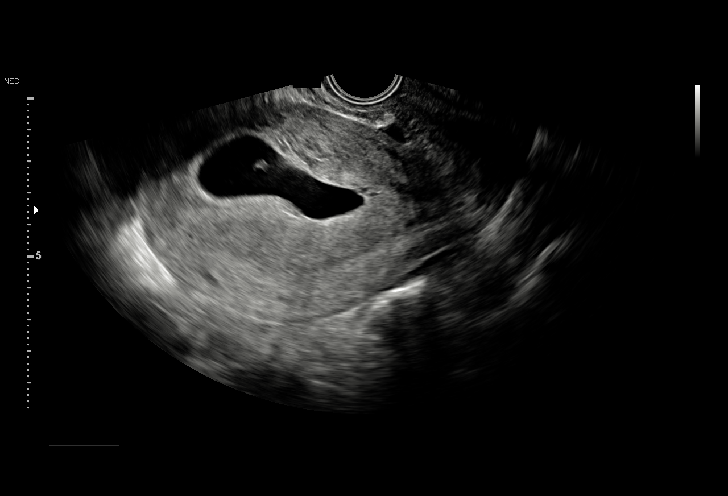

[15 of 26 positions shown; findings below may reference images not displayed]

FINDINGS: Intrauterine gestational sac: A single intrauterine gestational sac
is identified.

Yolk sac:  Not identified.

Embryo:  Fetal pole is identified.

Cardiac Activity: Not identified.

CRL: 5.3 mm   6 w   1 d

Subchorionic hemorrhage:  None visualized.

Maternal uterus/adnexae: Uterus is anteverted. No myometrial mass
lesions identified. Both ovaries are visualized and appear normal.
Probable corpus luteal cyst on the left ovary. Small amount of free
fluid in the pelvis.
IMPRESSION: Single intrauterine gestational sac which appears somewhat irregular
and large with respect to the fetal pole. Yolk sac is not
visualized. No fetal cardiac activity and a fetal pole of less than
7 mm. Findings are suspicious but not yet definitive for failed
pregnancy. Recommend follow-up US in 10-14 days for definitive
diagnosis. This recommendation follows SRU consensus guidelines:
Diagnostic Criteria for Nonviable Pregnancy Early in the First
Trimester. N Engl J Med 9593; [DATE].

## 2020-12-29 ENCOUNTER — Other Ambulatory Visit: Payer: Self-pay

## 2020-12-29 ENCOUNTER — Encounter (HOSPITAL_COMMUNITY): Payer: Self-pay

## 2020-12-29 ENCOUNTER — Ambulatory Visit (HOSPITAL_COMMUNITY)
Admission: EM | Admit: 2020-12-29 | Discharge: 2020-12-29 | Disposition: A | Discharge status: Home / Self Care | Payer: No Typology Code available for payment source | Attending: Emergency Medicine | Admitting: Emergency Medicine

## 2020-12-29 ENCOUNTER — Ambulatory Visit (INDEPENDENT_AMBULATORY_CARE_PROVIDER_SITE_OTHER): Payer: No Typology Code available for payment source

## 2020-12-29 DIAGNOSIS — M79641 Pain in right hand: Secondary | ICD-10-CM

## 2020-12-29 MED ORDER — PREDNISONE 10 MG PO TABS: 20.0000 mg | ORAL_TABLET | Freq: Every day | ORAL | 0 refills | Status: AC

## 2020-12-29 NOTE — ED Provider Notes (Addendum)
[facility]   [id number] 12/29/20 Arrival Time: 1640   Chief Complaint  Patient presents with   Hand Pain     SUBJECTIVE: History from: patient.  Emma Walter is a 40 y.o. female who presented to the urgent care with a  complaint of right hand pain for the past 4 days.  Denies any precipitating event.  She  localizes the pain to the right hand.  She describes the pain as constant and achy.  She has tried OTC medications without relief.  Her symptoms are made worse with ROM.  She denies similar symptoms in the past.   Denies chills, fever, nausea, vomiting and diarrhea.  ROS: As per HPI.  All other pertinent ROS negative.      Past Medical History:  Diagnosis Date   Abnormal Pap smear    Hx of chlamydia infection    Language barrier    Late prenatal care    Ovarian cyst    History of L   Past Surgical History:  Procedure Laterality Date   CRYOTHERAPY     No Known Allergies No current facility-administered medications on file prior to encounter.   Current Outpatient Medications on File Prior to Encounter  Medication Sig Dispense Refill   Prenatal Vit-Fe Fumarate-FA (PRENATAL MULTIVITAMIN) TABS Take 1 tablet by mouth daily. 11 tablet 30   Social History   Socioeconomic History   Marital status: Married    Spouse name: Not on file   Number of children: Not on file   Years of education: Not on file   Highest education level: Not on file  Occupational History   Not on file  Tobacco Use   Smoking status: Never   Smokeless tobacco: Never  Substance and Sexual Activity   Alcohol use: No   Drug use: No   Sexual activity: Yes  Other Topics Concern   Not on file  Social History Narrative   Not on file   Social Determinants of Health   Financial Resource Strain: Not on file  Food Insecurity: Not on file  Transportation Needs: Not on file  Physical Activity: Not on file  Stress: Not on file  Social Connections: Not on file  Intimate  Partner Violence: Not on file   Family History  Problem Relation Age of Onset   Birth defects Sister        had a "soft head" did not survive    OBJECTIVE:  Vitals:   12/29/20 1800  BP: 92/67  Pulse: 65  Resp: 16  Temp: 98.6 F (37 C)  TempSrc: Oral  SpO2: 100%     Physical Exam Vitals and nursing note reviewed.  Constitutional:      General: She is not in acute distress.    Appearance: Normal appearance. She is normal weight. She is not ill-appearing, toxic-appearing or diaphoretic.  HENT:     Head: Normocephalic.  Cardiovascular:     Rate and Rhythm: Normal rate and regular rhythm.     Pulses: Normal pulses.     Heart sounds: Normal heart sounds. No murmur heard.   No friction rub. No gallop.  Pulmonary:     Effort: Pulmonary effort is normal. No respiratory distress.     Breath sounds: Normal breath sounds. No stridor. No wheezing, rhonchi or rales.  Chest:     Chest wall: No tenderness.  Musculoskeletal:     Right hand: Tenderness present.     Left hand: Normal.     Comments:  The right hand is without any obvious asymmetry or deformity when compared to the left hand.  There is no ecchymosis, open wound, lesion or warmth present.  Tenderness with range of motion.  Neurovascular status is intact.  Neurological:     Mental Status: She is alert and oriented to person, place, and time.     LABS:  No results found for this or any previous visit (from the past 24 hour(s)).   RADIOLOGY  DG Hand Complete Right  Result Date: 12/29/2020 CLINICAL DATA:  Hand pain for 5 days, pain in joints of the fingers. EXAM: RIGHT HAND - COMPLETE 3+ VIEW COMPARISON:  None. FINDINGS: There is no evidence of fracture or dislocation. There is no evidence of arthropathy or other focal bone abnormality. Soft tissues are unremarkable. IMPRESSION: Negative. Electronically Signed   By: Emmaline Kluver M.D.   On: 12/29/2020 18:24     Right hand X-ray is negative for bony abnormality  including fracture or dislocation.  I have reviewed the x-ray myself and the radiologist interpretation.  I am in agreement with the radiologist interpretation.   ASSESSMENT & PLAN:  1. Right hand pain     Meds ordered this encounter  Medications   predniSONE (DELTASONE) 10 MG tablet    Sig: Take 2 tablets (20 mg total) by mouth daily for 5 days.    Dispense:  10 tablet    Refill:  0     Discharge Instructions  Take OTC Tylenol/ibuprofen as needed for pain Prescribed prednisone/take as directed Follow RICE instructions as attached Follow-up with PCP Return or go to ED if you develop any new or worsening symptoms   Reviewed expectations re: course of current medical issues. Questions answered. Outlined signs and symptoms indicating need for more acute intervention. Patient verbalized understanding. After Visit Summary given.          Durward Parcel, FNP 12/29/20 1836    Durward Parcel, FNP 12/29/20 1839

## 2020-12-29 NOTE — Discharge Instructions (Addendum)
Take OTC Tylenol/ibuprofen as needed for pain Prescribed prednisone/take as directed Follow RICE instructions as attached Follow-up with PCP Return or go to ED if you develop any new or worsening symptoms

## 2020-12-29 NOTE — ED Triage Notes (Signed)
Pt reports right hand pain x 4 days. Denies injury.

## 2023-06-01 ENCOUNTER — Ambulatory Visit (INDEPENDENT_AMBULATORY_CARE_PROVIDER_SITE_OTHER): Payer: Self-pay

## 2023-06-01 ENCOUNTER — Ambulatory Visit (HOSPITAL_COMMUNITY)
Admission: EM | Admit: 2023-06-01 | Discharge: 2023-06-01 | Disposition: A | Discharge status: Home / Self Care | Payer: Self-pay | Attending: Family Medicine | Admitting: Family Medicine

## 2023-06-01 ENCOUNTER — Encounter (HOSPITAL_COMMUNITY): Payer: Self-pay

## 2023-06-01 DIAGNOSIS — W19XXXA Unspecified fall, initial encounter: Secondary | ICD-10-CM

## 2023-06-01 DIAGNOSIS — M546 Pain in thoracic spine: Secondary | ICD-10-CM

## 2023-06-01 MED ORDER — MELOXICAM 15 MG PO TABS: ORAL_TABLET | ORAL | 0 refills | Status: AC

## 2023-06-01 NOTE — ED Triage Notes (Signed)
 Pt states last Tuesday while cleaning she fell and hit her upper ribs on the bathtub. States now having upper back pain since yesterday. Took tylenol  with some relief.

## 2023-06-01 NOTE — ED Provider Notes (Signed)
 [facility]    CSN: [id number] Arrival date & time: 06/01/23  4098      History   Chief Complaint Chief Complaint  Patient presents with   Fall    HPI Emma Walter is a 43 y.o. female.   No noted over-the-counter Flonase given lack of red flag symptoms.  Patient notes that she was cleaning last Tuesday and fell.  Patient states that she hit her of her ribs on the bathtub.  Patient notes that she continues to have some pain beneath her breastbone bilaterally on the anterior portion as well as some back pain near the thoracic portion.  Patient has some pain with inspiration as well.   Fall    Past Medical History:  Diagnosis Date   Abnormal Pap smear    Hx of chlamydia infection    Language barrier    Late prenatal care    Ovarian cyst    History of L    There are no active problems to display for this patient.   Past Surgical History:  Procedure Laterality Date   CRYOTHERAPY      OB History     Gravida  4   Para  3   Term  3   Preterm      AB      Living  3      SAB      IAB      Ectopic      Multiple      Live Births  3            Home Medications    Prior to Admission medications   Medication Sig Start Date End Date Taking? Authorizing Provider  meloxicam  (MOBIC ) 15 MG tablet Take 1 tablet daily with food for 10 days. 06/01/23  Yes Jude Norton, MD    Family History Family History  Problem Relation Age of Onset   Birth defects Sister        had a "soft head" did not survive    Social History Social History   Tobacco Use   Smoking status: Never   Smokeless tobacco: Never  Substance Use Topics   Alcohol use: No   Drug use: No     Allergies   Patient has no known allergies.   Review of Systems Review of Systems   Physical Exam Triage Vital Signs ED Triage Vitals [06/01/23 1057]  Encounter Vitals Group     BP 114/72     Systolic BP Percentile      Diastolic BP Percentile       Pulse Rate 72     Resp 18     Temp 98.7 F (37.1 C)     Temp Source Oral     SpO2 98 %     Weight      Height      Head Circumference      Peak Flow      Pain Score      Pain Loc      Pain Education      Exclude from Growth Chart    No data found.  Updated Vital Signs BP 114/72 (BP Location: Left Arm)   Pulse 72   Temp 98.7 F (37.1 C) (Oral)   Resp 18   LMP 05/11/2023 (Approximate)   SpO2 98%   Visual Acuity Right Eye Distance:   Left Eye Distance:   Bilateral Distance:    Right Eye Near:  Left Eye Near:    Bilateral Near:     Physical Exam Noted tenderness along the sternum distally over the zygomatic bone bilaterally.  There is some mild tenderness over the thoracic paraspinal area.  No tenderness over the spinous processes themselves.  Range of motion is full with flexion extension of the upper back.  Mild pain with twisting and side bending.  No neurological deficit.  UC Treatments / Results  Labs (all labs ordered are listed, but only abnormal results are displayed) Labs Reviewed - No data to display  EKG   Radiology No results found.  Procedures Procedures (including critical care time)  Medications Ordered in UC Medications - No data to display  Initial Impression / Assessment and Plan / UC Course  I have reviewed the triage vital signs and the nursing notes.  Pertinent labs & imaging results that were available during my care of the patient were reviewed by me and considered in my medical decision making (see chart for details).     Patient likely dealing with traumatic injury of the sternum as well as the paraspinal muscles of the thoracic area.  X-rays do show some noted scoliosis of the thoracic spine.  No acute fracture.  At this time, we will go ahead with meloxicam  daily for the next 10 days.  Patient understanding agreeable plan. Final Clinical Impressions(s) / UC Diagnoses   Final diagnoses:  Fall, initial encounter     Discharge  Instructions      Please take your meloxicam  daily with food.  If you take your meloxicam , please do not take any other NSAIDs or ibuprofen  medication for the diarrhea.  Please take the medication for the next 10 days.     ED Prescriptions     Medication Sig Dispense Auth. Provider   meloxicam  (MOBIC ) 15 MG tablet Take 1 tablet daily with food for 10 days. 10 tablet Sharrod Achille, MD      PDMP not reviewed this encounter.   Jude Norton, MD 06/01/23 1141

## 2023-06-01 NOTE — Discharge Instructions (Addendum)
 Please take your meloxicam  daily with food.  If you take your meloxicam , please do not take any other NSAIDs or ibuprofen  medication for the diarrhea.  Please take the medication for the next 10 days.
